# Patient Record
Sex: Female | Born: 1990 | Race: Black or African American | Hispanic: No | Smoking: Former smoker
Health system: Southern US, Community
[De-identification: ages and names within clinical notes are randomized; demographics above are authoritative.]

## PROBLEM LIST (undated history)

## (undated) DIAGNOSIS — B373 Candidiasis of vulva and vagina: Secondary | ICD-10-CM

## (undated) DIAGNOSIS — O24419 Gestational diabetes mellitus in pregnancy, unspecified control: Secondary | ICD-10-CM

## (undated) DIAGNOSIS — Z309 Encounter for contraceptive management, unspecified: Secondary | ICD-10-CM

## (undated) DIAGNOSIS — Z975 Presence of (intrauterine) contraceptive device: Secondary | ICD-10-CM

## (undated) DIAGNOSIS — Z3490 Encounter for supervision of normal pregnancy, unspecified, unspecified trimester: Secondary | ICD-10-CM

## (undated) DIAGNOSIS — A549 Gonococcal infection, unspecified: Secondary | ICD-10-CM

## (undated) DIAGNOSIS — N898 Other specified noninflammatory disorders of vagina: Secondary | ICD-10-CM

## (undated) DIAGNOSIS — A749 Chlamydial infection, unspecified: Secondary | ICD-10-CM

## (undated) HISTORY — DX: Chlamydial infection, unspecified: A74.9

## (undated) HISTORY — DX: Gonococcal infection, unspecified: A54.9

## (undated) HISTORY — PX: NO PAST SURGERIES: SHX2092

## (undated) HISTORY — DX: Candidiasis of vulva and vagina: B37.3

## (undated) HISTORY — DX: Encounter for supervision of normal pregnancy, unspecified, unspecified trimester: Z34.90

## (undated) HISTORY — DX: Presence of (intrauterine) contraceptive device: Z97.5

## (undated) HISTORY — DX: Encounter for contraceptive management, unspecified: Z30.9

## (undated) HISTORY — DX: Gestational diabetes mellitus in pregnancy, unspecified control: O24.419

## (undated) HISTORY — DX: Other specified noninflammatory disorders of vagina: N89.8

---
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
## 2010-06-02 ENCOUNTER — Emergency Department (HOSPITAL_COMMUNITY): Admission: EM | Admit: 2010-06-02 | Discharge: 2010-06-02 | Payer: Self-pay | Admitting: Emergency Medicine

## 2010-10-26 ENCOUNTER — Emergency Department (HOSPITAL_COMMUNITY)
Admission: EM | Admit: 2010-10-26 | Discharge: 2010-10-26 | Payer: Self-pay | Source: Home / Self Care | Admitting: Emergency Medicine

## 2010-12-09 ENCOUNTER — Emergency Department (HOSPITAL_COMMUNITY)
Admission: EM | Admit: 2010-12-09 | Discharge: 2010-12-09 | Payer: Self-pay | Source: Home / Self Care | Admitting: Emergency Medicine

## 2010-12-09 LAB — URINALYSIS, ROUTINE W REFLEX MICROSCOPIC
Bilirubin Urine: NEGATIVE
Hgb urine dipstick: NEGATIVE
Ketones, ur: NEGATIVE mg/dL
Specific Gravity, Urine: 1.02 (ref 1.005–1.030)
Urobilinogen, UA: 0.2 mg/dL (ref 0.0–1.0)

## 2010-12-09 LAB — BASIC METABOLIC PANEL
BUN: 10 mg/dL (ref 6–23)
CO2: 24 mEq/L (ref 19–32)
Calcium: 8.9 mg/dL (ref 8.4–10.5)
Chloride: 100 mEq/L (ref 96–112)
Creatinine, Ser: 0.87 mg/dL (ref 0.4–1.2)
GFR calc Af Amer: >60 mL/min (ref >60)
Glucose, Bld: 89 mg/dL (ref 70–99)
Sodium: 133 mEq/L — ABNORMAL LOW (ref 135–145)

## 2010-12-09 LAB — CBC
Hemoglobin: 11.3 g/dL — ABNORMAL LOW (ref 12.0–15.0)
MCH: 26.7 pg (ref 26.0–34.0)
RBC: 4.24 MIL/uL (ref 3.87–5.11)
WBC: 6.9 10*3/uL (ref 4.0–10.5)

## 2010-12-09 LAB — DIFFERENTIAL
Basophils Relative: 1 % (ref 0–1)
Monocytes Absolute: 0.8 10*3/uL (ref 0.1–1.0)
Neutro Abs: 4.2 10*3/uL (ref 1.7–7.7)

## 2011-01-25 LAB — BASIC METABOLIC PANEL
BUN: 10 mg/dL (ref 6–23)
Calcium: 9.8 mg/dL (ref 8.4–10.5)
Chloride: 102 mEq/L (ref 96–112)
GFR calc non Af Amer: >60 mL/min (ref >60)
Potassium: 4.3 mEq/L (ref 3.5–5.1)

## 2011-01-25 LAB — URINALYSIS, ROUTINE W REFLEX MICROSCOPIC
Bilirubin Urine: NEGATIVE
Glucose, UA: NEGATIVE mg/dL
Nitrite: NEGATIVE
Protein, ur: NEGATIVE mg/dL
Specific Gravity, Urine: 1.015 (ref 1.005–1.030)

## 2011-01-25 LAB — CBC
MCH: 26.6 pg (ref 26.0–34.0)
RDW: 14.4 % (ref 11.5–15.5)
WBC: 7.7 10*3/uL (ref 4.0–10.5)

## 2011-01-25 LAB — GC/CHLAMYDIA PROBE AMP, GENITAL
Chlamydia, DNA Probe: POSITIVE — AB
GC Probe Amp, Genital: POSITIVE — AB

## 2011-01-25 LAB — DIFFERENTIAL
Basophils Absolute: 0 10*3/uL (ref 0.0–0.1)
Eosinophils Absolute: 0.1 10*3/uL (ref 0.0–0.7)
Neutro Abs: 5 10*3/uL (ref 1.7–7.7)

## 2011-01-25 LAB — URINE MICROSCOPIC-ADD ON

## 2011-01-25 LAB — WET PREP, GENITAL: Yeast Wet Prep HPF POC: NONE SEEN

## 2011-01-25 LAB — POCT PREGNANCY, URINE: Preg Test, Ur: NEGATIVE
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
## 2012-12-08 ENCOUNTER — Encounter (HOSPITAL_COMMUNITY): Payer: Self-pay | Admitting: *Deleted

## 2012-12-08 ENCOUNTER — Emergency Department (HOSPITAL_COMMUNITY)
Admission: EM | Admit: 2012-12-08 | Discharge: 2012-12-08 | Disposition: A | Discharge status: Home / Self Care | Payer: Self-pay | Attending: Emergency Medicine | Admitting: Emergency Medicine

## 2012-12-08 DIAGNOSIS — R21 Rash and other nonspecific skin eruption: Secondary | ICD-10-CM | POA: Insufficient documentation

## 2012-12-08 DIAGNOSIS — F17200 Nicotine dependence, unspecified, uncomplicated: Secondary | ICD-10-CM | POA: Insufficient documentation

## 2012-12-08 MED ORDER — FEXOFENADINE HCL 180 MG PO TABS: 180.0000 mg | ORAL_TABLET | Freq: Every day | ORAL | Status: DC

## 2012-12-08 MED ORDER — FAMOTIDINE 20 MG PO TABS
20.0000 mg | ORAL_TABLET | Freq: Once | ORAL | Status: AC
  Administered 2012-12-08: 20 mg via ORAL
  Filled 2012-12-08: qty 1

## 2012-12-08 MED ORDER — ONDANSETRON HCL 4 MG PO TABS
4.0000 mg | ORAL_TABLET | Freq: Once | ORAL | Status: AC
  Administered 2012-12-08: 4 mg via ORAL
  Filled 2012-12-08: qty 1

## 2012-12-08 MED ORDER — PREDNISONE 10 MG PO TABS: ORAL_TABLET | ORAL | Status: DC

## 2012-12-08 MED ORDER — DIPHENHYDRAMINE HCL 25 MG PO CAPS
25.0000 mg | ORAL_CAPSULE | Freq: Once | ORAL | Status: AC
  Administered 2012-12-08: 25 mg via ORAL
  Filled 2012-12-08: qty 1

## 2012-12-08 MED ORDER — HYDROXYZINE HCL 25 MG PO TABS: ORAL_TABLET | ORAL | Status: DC

## 2012-12-08 MED ORDER — PREDNISONE 50 MG PO TABS
60.0000 mg | ORAL_TABLET | Freq: Once | ORAL | Status: AC
  Administered 2012-12-08: 60 mg via ORAL
  Filled 2012-12-08: qty 1

## 2012-12-08 NOTE — ED Notes (Signed)
Itching red rash for 3 days.

## 2012-12-08 NOTE — ED Provider Notes (Addendum)
History     CSN: [id number]  Arrival date & time 12/08/12  1353   First MD Initiated Contact with Patient 12/08/12 1445      Chief Complaint  Patient presents with  . Rash    (Consider location/radiation/quality/duration/timing/severity/associated sxs/prior treatment) Patient is a 22 y.o. female presenting with rash. The history is provided by the patient.  Rash  This is a new problem. The current episode started more than 2 days ago. The problem has been gradually worsening. The problem is associated with an unknown factor. There has been no fever. The rash is present on the abdomen and back. The patient is experiencing no pain. Associated symptoms include itching. Pertinent negatives include no weeping. She has tried antihistamines for the symptoms. The treatment provided no relief.    History reviewed. No pertinent past medical history.  History reviewed. No pertinent past surgical history.  History reviewed. No pertinent family history.  History  Substance Use Topics  . Smoking status: Current Every Day Smoker  . Smokeless tobacco: Not on file  . Alcohol Use: No    OB History    Grav Para Term Preterm Abortions TAB SAB Ect Mult Living                  Review of Systems  Constitutional: Negative for activity change.       All ROS Neg except as noted in HPI  HENT: Negative for nosebleeds and neck pain.   Eyes: Negative for photophobia and discharge.  Respiratory: Negative for cough, shortness of breath and wheezing.   Cardiovascular: Negative for chest pain and palpitations.  Gastrointestinal: Negative for abdominal pain and blood in stool.  Genitourinary: Negative for dysuria, frequency and hematuria.  Musculoskeletal: Negative for back pain and arthralgias.  Skin: Positive for itching and rash.  Neurological: Negative for dizziness, seizures and speech difficulty.  Psychiatric/Behavioral: Negative for hallucinations and confusion.    Allergies  Review of  patient's allergies indicates no known allergies.  Home Medications   Current Outpatient Rx  Name  Route  Sig  Dispense  Refill  . FEXOFENADINE HCL 180 MG PO TABS   Oral   Take 1 tablet (180 mg total) by mouth daily.   10 tablet   0   . HYDROXYZINE HCL 25 MG PO TABS      1 at hs or q6h prn hives/itching   20 tablet   0   . PREDNISONE 10 MG PO TABS      6,5,4,3,2,1 - take with food   21 tablet   0     BP 125/67  Pulse 70  Temp 97.7 F (36.5 C) (Oral)  Resp 18  Ht 4\' 11"  (1.499 m)  Wt 142 lb (64.411 kg)  BMI 28.68 kg/m2  SpO2 100%  LMP 11/11/2012  Physical Exam  Nursing note and vitals reviewed. Constitutional: She is oriented to person, place, and time. She appears well-developed and well-nourished.  Non-toxic appearance.  HENT:  Head: Normocephalic.  Right Ear: Tympanic membrane and external ear normal.  Left Ear: Tympanic membrane and external ear normal.  Eyes: EOM and lids are normal. Pupils are equal, round, and reactive to light.  Neck: Normal range of motion. Neck supple. Carotid bruit is not present.  Cardiovascular: Normal rate, regular rhythm, normal heart sounds, intact distal pulses and normal pulses.   Pulmonary/Chest: Breath sounds normal. No stridor. No respiratory distress. She has no wheezes.  Abdominal: Soft. Bowel sounds are normal. There is no tenderness.  There is no guarding.  Musculoskeletal: Normal range of motion.  Lymphadenopathy:       Head (right side): No submandibular adenopathy present.       Head (left side): No submandibular adenopathy present.    She has no cervical adenopathy.  Neurological: She is alert and oriented to person, place, and time. She has normal strength. No cranial nerve deficit or sensory deficit.  Skin: Skin is warm and dry.       RED RAISED RASH ON THE RIGHT FLANK AND LEFT ABD. 1 ON THE BACK.  Psychiatric: She has a normal mood and affect. Her speech is normal.    ED Course  Procedures (including critical  care time)  Labs Reviewed - No data to display No results found.   1. Hive       MDM  I have reviewed nursing notes, vital signs, and all appropriate lab and imaging results for this patient. Patient is a rash on the right flank area. On the left abdomen and one lesion on the back. Explained to the patient that this may be a viral rash, a contact dermatitis, or a hive. Patient is treated with Allegra each morning and Vistaril at bedtime and is given a referral to dermatology she has an allergy to peanuts and a few other things. Patient is advised to return to the emergency department immediately if any changes, problems, or concerns.       Kathie Dike, PA 12/08/12 [address] [place], [place] 12/21/12 [phone]

## 2012-12-09 NOTE — ED Provider Notes (Signed)
Medical screening examination/treatment/procedure(s) were performed by non-physician practitioner and as supervising physician I was immediately available for consultation/collaboration.  Raeford Razor, MD 12/09/12 [phone]

## 2012-12-23 NOTE — ED Provider Notes (Signed)
Medical screening examination/treatment/procedure(s) were performed by non-physician practitioner and as supervising physician I was immediately available for consultation/collaboration.  Halford Goetzke, MD 12/23/12 2346 

## 2013-02-15 ENCOUNTER — Ambulatory Visit (INDEPENDENT_AMBULATORY_CARE_PROVIDER_SITE_OTHER): Payer: Self-pay | Admitting: Family Medicine

## 2013-02-15 ENCOUNTER — Encounter: Payer: Self-pay | Admitting: Family Medicine

## 2013-02-15 VITALS — BP 112/80 | Temp 98.8°F | Wt 145.8 lb

## 2013-02-15 DIAGNOSIS — R300 Dysuria: Secondary | ICD-10-CM

## 2013-02-15 DIAGNOSIS — N912 Amenorrhea, unspecified: Secondary | ICD-10-CM

## 2013-02-15 DIAGNOSIS — N390 Urinary tract infection, site not specified: Secondary | ICD-10-CM

## 2013-02-15 LAB — POCT URINALYSIS DIPSTICK
Glucose, UA: NEGATIVE
Spec Grav, UA: 1.01
pH, UA: 6

## 2013-02-15 LAB — POCT URINE PREGNANCY: Preg Test, Ur: NEGATIVE

## 2013-02-15 MED ORDER — CIPROFLOXACIN HCL 500 MG PO TABS: 500.0000 mg | ORAL_TABLET | Freq: Two times a day (BID) | ORAL | Status: AC

## 2013-02-15 NOTE — Progress Notes (Signed)
  Subjective:    Patient ID: Courtney Murray, female    DOB: 1990-12-24, 22 y.o.   MRN: [id number]  Dysuria  This is a new problem. The current episode started in the past 7 days. The problem occurs every urination. The problem has been gradually worsening. The quality of the pain is described as burning. There has been no fever. She is sexually active. There is no history of pyelonephritis. Associated symptoms include chills, flank pain (left side), nausea and vomiting. She has tried increased fluids for the symptoms. The treatment provided mild relief.      Review of Systems  Constitutional: Positive for chills.  Gastrointestinal: Positive for nausea and vomiting.  Genitourinary: Positive for dysuria and flank pain (left side).  All other systems reviewed and are negative.   Results for orders placed in visit on 02/15/13  POCT URINALYSIS DIPSTICK      Result Value Range   Color, UA       Clarity, UA       Glucose, UA neg     Bilirubin, UA       Ketones, UA       Spec Grav, UA 1.010     Blood, UA neg     pH, UA 6.0     Protein, UA small     Urobilinogen, UA       Nitrite, UA       Leukocytes, UA moderate (2+)    POCT URINE PREGNANCY      Result Value Range   Preg Test, Ur Negative       8-10 white blood cells per high-power field Objective:   Physical Exam   Alert no acute distress. Vitals reviewed. HEENT normal. Lungs clear. Heart regular in rhythm. No true right CVA tenderness, however some lateral chest wall tenderness on the right side.     Assessment & Plan:  Impression urinary tract infection with vomiting times one and right lateral pain will cover for possible pyelonephritis discussed. Plan as per orders.

## 2013-02-15 NOTE — Patient Instructions (Signed)
Take all the antibiotics 

## 2013-06-05 ENCOUNTER — Encounter: Payer: Self-pay | Admitting: Family Medicine

## 2013-06-05 ENCOUNTER — Ambulatory Visit (INDEPENDENT_AMBULATORY_CARE_PROVIDER_SITE_OTHER): Payer: Self-pay | Admitting: Family Medicine

## 2013-06-05 VITALS — BP 110/70 | Temp 98.1°F | Wt 148.4 lb

## 2013-06-05 DIAGNOSIS — J069 Acute upper respiratory infection, unspecified: Secondary | ICD-10-CM

## 2013-06-05 DIAGNOSIS — H6691 Otitis media, unspecified, right ear: Secondary | ICD-10-CM

## 2013-06-05 DIAGNOSIS — H6690 Otitis media, unspecified, unspecified ear: Secondary | ICD-10-CM

## 2013-06-05 MED ORDER — AMOXICILLIN 500 MG PO TABS: 500.0000 mg | ORAL_TABLET | Freq: Three times a day (TID) | ORAL | Status: DC

## 2013-06-05 NOTE — Patient Instructions (Signed)
Loratadine 10 mg take one daily for next 1 to 2 weeks  May use ibuprofen 200 mg , take 3 tablets as needed every 6 hours for pain

## 2013-06-05 NOTE — Progress Notes (Signed)
  Subjective:    Patient ID: Courtney Murray, female    DOB: 04-16-1991, 22 y.o.   MRN: [id number]  Otalgia  There is pain in the right ear. This is a new problem. The current episode started yesterday.   Patient with a little bit head congestion some stuffiness no wheezing or difficulty breathing complains right ear pain denies cough or vomiting   Review of Systems  HENT: Positive for ear pain.        Objective:   Physical Exam Eardrums left side normal right side otitis media throat is normal neck supple lungs clear       Assessment & Plan:  URI with right otitis-amoxicillin 3 times a day 10 days, Tylenol when necessary or ibuprofen when necessary pain, loratadine daily for the next couple weeks if any trouble let us know

## 2014-01-16 ENCOUNTER — Ambulatory Visit: Payer: BC Managed Care – PPO | Admitting: Nurse Practitioner

## 2014-04-29 ENCOUNTER — Ambulatory Visit (INDEPENDENT_AMBULATORY_CARE_PROVIDER_SITE_OTHER): Payer: BC Managed Care – PPO | Admitting: Adult Health

## 2014-04-29 ENCOUNTER — Encounter: Payer: Self-pay | Admitting: Adult Health

## 2014-04-29 DIAGNOSIS — Z3202 Encounter for pregnancy test, result negative: Secondary | ICD-10-CM

## 2014-04-29 LAB — POCT URINE PREGNANCY: Preg Test, Ur: NEGATIVE

## 2014-05-09 ENCOUNTER — Encounter: Payer: Self-pay | Admitting: Adult Health

## 2014-05-09 ENCOUNTER — Other Ambulatory Visit (HOSPITAL_COMMUNITY)
Admission: RE | Admit: 2014-05-09 | Discharge: 2014-05-09 | Disposition: A | Discharge status: Home / Self Care | Payer: BC Managed Care – PPO | Source: Ambulatory Visit | Attending: Obstetrics and Gynecology | Admitting: Obstetrics and Gynecology

## 2014-05-09 ENCOUNTER — Ambulatory Visit (INDEPENDENT_AMBULATORY_CARE_PROVIDER_SITE_OTHER): Payer: BC Managed Care – PPO | Admitting: Adult Health

## 2014-05-09 VITALS — BP 118/70 | HR 76 | Ht <= 58 in | Wt 155.5 lb

## 2014-05-09 DIAGNOSIS — Z309 Encounter for contraceptive management, unspecified: Secondary | ICD-10-CM

## 2014-05-09 DIAGNOSIS — Z01419 Encounter for gynecological examination (general) (routine) without abnormal findings: Secondary | ICD-10-CM

## 2014-05-09 DIAGNOSIS — Z30011 Encounter for initial prescription of contraceptive pills: Secondary | ICD-10-CM

## 2014-05-09 HISTORY — DX: Encounter for contraceptive management, unspecified: Z30.9

## 2014-05-09 MED ORDER — NORETHIN ACE-ETH ESTRAD-FE 1-20 MG-MCG(24) PO CHEW
CHEWABLE_TABLET | ORAL | Status: DC
Start: 2014-05-09 — End: 2014-11-25

## 2014-05-09 NOTE — Progress Notes (Signed)
Patient ID: Courtney Murray, female   DOB: Aug 21, 1991, 23 y.o.   MRN: [id number] History of Present Illness:  Courtney Murray is a [age] year old black female, single, new to this practice, in for pap and physical and to discuss birth control.Has used pills in past.  Current Medications, Allergies, Past Medical History, Past Surgical History, Family History and Social History were reviewed in [name].     Review of Systems: Patient denies any headaches, blurred vision, shortness of breath, chest pain, abdominal pain, problems with bowel movements, urination, or intercourse. No discharge or odor. No joint pain or mood swings.   Physical Exam:BP 118/70  Pulse 76  Ht 4\' 10"  (1.473 m)  Wt 155 lb 8 oz (70.534 kg)  BMI 32.51 kg/m2  LMP 05/02/2014 General:  Well developed, well nourished, no acute distress Skin:  Warm and dry Neck:  Midline trachea, normal thyroid Lungs; Clear to auscultation bilaterally Breast:  No dominant palpable mass, retraction, or nipple discharge Cardiovascular: Regular rate and rhythm Abdomen:  Soft, non tender, no hepatosplenomegaly Pelvic:  External genitalia is normal in appearance.  The vagina is normal in appearance. The cervix is nulliparous, pap performed.  Uterus is felt to be normal size, shape, and contour.  No                adnexal masses or tenderness noted. Extremities:  No swelling or varicosities noted Psych:  No mood changes, alert and cooperative,seems happy, works at [employer].   Impression: Yearly gyn exam Contraceptive management    Plan: Rx minastrin take 1 daily disp 1 pack with 11 refills Use condoms Physical in 1 year Return in 3 months for ROS Review handout on OC use and nexplanon

## 2014-05-09 NOTE — Patient Instructions (Signed)
Oral Contraception Use Oral contraceptive pills (OCPs) are medicines taken to prevent pregnancy. OCPs work by preventing the ovaries from releasing eggs. The hormones in OCPs also cause the cervical mucus to thicken, preventing the sperm from entering the uterus. The hormones also cause the uterine lining to become thin, not allowing a fertilized egg to attach to the inside of the uterus. OCPs are highly effective when taken exactly as prescribed. However, OCPs do not prevent sexually transmitted diseases (STDs). Safe sex practices, such as using condoms along with an OCP, can help prevent STDs. Before taking OCPs, you may have a physical exam and Pap test. Your health care provider may also order blood tests if necessary. Your health care provider will make sure you are a good candidate for oral contraception. Discuss with your health care provider the possible side effects of the OCP you may be prescribed. When starting an OCP, it can take 2 to 3 months for the body to adjust to the changes in hormone levels in your body.  HOW TO TAKE ORAL CONTRACEPTIVE PILLS Your health care provider may advise you on how to start taking the first cycle of OCPs. Otherwise, you can:   Start on day 1 of your menstrual period. You will not need any backup contraceptive protection with this start time.   Start on the first Sunday after your menstrual period or the day you get your prescription. In these cases, you will need to use backup contraceptive protection for the first week.   Start the pill at any time of your cycle. If you take the pill within 5 days of the start of your period, you are protected against pregnancy right away. In this case, you will not need a backup form of birth control. If you start at any other time of your menstrual cycle, you will need to use another form of birth control for 7 days. If your OCP is the type called a minipill, it will protect you from pregnancy after taking it for 2 days (48  hours). After you have started taking OCPs:   If you forget to take 1 pill, take it as soon as you remember. Take the next pill at the regular time.   If you miss 2 or more pills, call your health care provider because different pills have different instructions for missed doses. Use backup birth control until your next menstrual period starts.   If you use a 28-day pack that contains inactive pills and you miss 1 of the last 7 pills (pills with no hormones), it will not matter. Throw away the rest of the nonhormone pills and start a new pill pack.  No matter which day you start the OCP, you will always start a new pack on that same day of the week. Have an extra pack of OCPs and a backup contraceptive method available in case you miss some pills or lose your OCP pack.  HOME CARE INSTRUCTIONS   Do not smoke.   Always use a condom to protect against STDs. OCPs do not protect against STDs.   Use a calendar to mark your menstrual period days.   Read the information and directions that came with your OCP. Talk to your health care provider if you have questions.  SEEK MEDICAL CARE IF:   You develop nausea and vomiting.   You have abnormal vaginal discharge or bleeding.   You develop a rash.   You miss your menstrual period.   You are losing   your hair.   You need treatment for mood swings or depression.   You get dizzy when taking the OCP.   You develop acne from taking the OCP.   You become pregnant.  SEEK IMMEDIATE MEDICAL CARE IF:   You develop chest pain.   You develop shortness of breath.   You have an uncontrolled or severe headache.   You develop numbness or slurred speech.   You develop visual problems.   You develop pain, redness, and swelling in the legs.  Document Released: 10/21/2011 Document Revised: 07/04/2013 Document Reviewed: 04/22/2013 [facility] Patient Information 2015 [place], [place]. This information is not intended to replace  advice given to you by your health care provider. Make sure you discuss any questions you have with your health care provider. Start OCs with next period Use condoms  follow up in 3 months Physical in 1ear

## 2014-05-13 LAB — CYTOLOGY - PAP

## 2014-08-05 ENCOUNTER — Encounter: Payer: Self-pay | Admitting: Family Medicine

## 2014-08-05 ENCOUNTER — Ambulatory Visit (INDEPENDENT_AMBULATORY_CARE_PROVIDER_SITE_OTHER): Payer: BC Managed Care – PPO | Admitting: Family Medicine

## 2014-08-05 VITALS — BP 120/80 | Temp 98.7°F | Ht <= 58 in | Wt 154.6 lb

## 2014-08-05 DIAGNOSIS — J329 Chronic sinusitis, unspecified: Secondary | ICD-10-CM

## 2014-08-05 MED ORDER — BENZONATATE 100 MG PO CAPS: 100.0000 mg | ORAL_CAPSULE | Freq: Four times a day (QID) | ORAL | Status: DC | PRN

## 2014-08-05 MED ORDER — AZITHROMYCIN 250 MG PO TABS: ORAL_TABLET | ORAL | Status: DC

## 2014-08-05 NOTE — Progress Notes (Signed)
   Subjective:    Patient ID: Courtney Murray, female    DOB: 12/10/1990, 23 y.o.   MRN: [id number]  Fever  This is a new problem. The current episode started in the past 7 days. Associated symptoms include coughing, a sore throat, vomiting and wheezing.   When weather changes or with a lot of dust can get aller  Woke up in a sweat and vomited  Threw up twice  No diarrhea  Some clear dischage, Cough very bad night'  Chilled at times    No sig heada hes except with coughing Review of Systems  Constitutional: Positive for fever.  HENT: Positive for sore throat.   Respiratory: Positive for cough and wheezing.   Gastrointestinal: Positive for vomiting.       Objective:   Physical Exam  Alert moderate malaise. H&T moderate his congestion. Frontal tenderness pharynx erythematous neck supple. Lungs clear. Heart regular in rhythm.     Assessment & Plan:  Impression acute rhinosinusitis plan antibiotics prescribed. Symptomatic care discussed. WSL

## 2014-08-09 ENCOUNTER — Ambulatory Visit: Payer: BC Managed Care – PPO | Admitting: Adult Health

## 2014-08-27 ENCOUNTER — Encounter: Payer: Self-pay | Admitting: Adult Health

## 2014-08-27 ENCOUNTER — Ambulatory Visit (INDEPENDENT_AMBULATORY_CARE_PROVIDER_SITE_OTHER): Payer: BC Managed Care – PPO | Admitting: Adult Health

## 2014-08-27 VITALS — BP 112/60 | Ht <= 58 in | Wt 152.0 lb

## 2014-08-27 DIAGNOSIS — B3731 Acute candidiasis of vulva and vagina: Secondary | ICD-10-CM | POA: Insufficient documentation

## 2014-08-27 DIAGNOSIS — L298 Other pruritus: Secondary | ICD-10-CM

## 2014-08-27 DIAGNOSIS — B373 Candidiasis of vulva and vagina: Secondary | ICD-10-CM

## 2014-08-27 DIAGNOSIS — N898 Other specified noninflammatory disorders of vagina: Secondary | ICD-10-CM

## 2014-08-27 HISTORY — DX: Acute candidiasis of vulva and vagina: B37.31

## 2014-08-27 HISTORY — DX: Other specified noninflammatory disorders of vagina: N89.8

## 2014-08-27 HISTORY — DX: Candidiasis of vulva and vagina: B37.3

## 2014-08-27 LAB — POCT WET PREP (WET MOUNT): WBC, Wet Prep HPF POC: POSITIVE

## 2014-08-27 MED ORDER — FLUCONAZOLE 150 MG PO TABS: ORAL_TABLET | ORAL | Status: DC

## 2014-08-27 NOTE — Progress Notes (Signed)
Subjective:     Patient ID: Courtney Murray, female   DOB: October 05, 1991, 23 y.o.   MRN: [id number]  HPI Courtney Murray is a 23 year old black female in complaining of vaginal itching x 4-5 days, was on antibiotics about 2-3 weeks ago, no new sex partners or products.  Review of Systems See HPI Reviewed past medical,surgical, social and family history. Reviewed medications and allergies.     Objective:   Physical Exam BP 112/60  Ht 4\' 9"  (1.448 m)  Wt 152 lb (68.947 kg)  BMI 32.88 kg/m2  LMP 08/14/2014   Skin warm and dry.Pelvic: external genitalia is normal in appearance red in creases, vagina: white clumpy discharge without odor, red sidewalls, cervix:smooth, uterus: normal size, shape and contour, non tender, no masses felt, adnexa: no masses or tenderness noted. Wet prep: + for yeast and +WBCs.Painted with gentian violet after removing some discharge with big Q tip. GC/CHL obtained.   Assessment:     Vaginal itch Yeast infection    Plan:    GC/CHL sent Rx diflucan 150 mg #2 take 1 now and 1 in 3 days if needed with 1 refill Follow up prn Review handout on yeast infection

## 2014-08-27 NOTE — Patient Instructions (Signed)
Monilial Vaginitis Vaginitis in a soreness, swelling and redness (inflammation) of the vagina and vulva. Monilial vaginitis is not a sexually transmitted infection. CAUSES  Yeast vaginitis is caused by yeast (candida) that is normally found in your vagina. With a yeast infection, the candida has overgrown in number to a point that upsets the chemical balance. SYMPTOMS   White, thick vaginal discharge.  Swelling, itching, redness and irritation of the vagina and possibly the lips of the vagina (vulva).  Burning or painful urination.  Painful intercourse. DIAGNOSIS  Things that may contribute to monilial vaginitis are:  Postmenopausal and virginal states.  Pregnancy.  Infections.  Being tired, sick or stressed, especially if you had monilial vaginitis in the past.  Diabetes. Good control will help lower the chance.  Birth control pills.  Tight fitting garments.  Using bubble bath, feminine sprays, douches or deodorant tampons.  Taking certain medications that kill germs (antibiotics).  Sporadic recurrence can occur if you become ill. TREATMENT  Your caregiver will give you medication.  There are several kinds of anti monilial vaginal creams and suppositories specific for monilial vaginitis. For recurrent yeast infections, use a suppository or cream in the vagina 2 times a week, or as directed.  Anti-monilial or steroid cream for the itching or irritation of the vulva may also be used. Get your caregiver's permission.  Painting the vagina with methylene blue solution may help if the monilial cream does not work.  Eating yogurt may help prevent monilial vaginitis. HOME CARE INSTRUCTIONS   Finish all medication as prescribed.  Do not have sex until treatment is completed or after your caregiver tells you it is okay.  Take warm sitz baths.  Do not douche.  Do not use tampons, especially scented ones.  Wear cotton underwear.  Avoid tight pants and panty  hose.  Tell your sexual partner that you have a yeast infection. They should go to their caregiver if they have symptoms such as mild rash or itching.  Your sexual partner should be treated as well if your infection is difficult to eliminate.  Practice safer sex. Use condoms.  Some vaginal medications cause latex condoms to fail. Vaginal medications that harm condoms are:  Cleocin cream.  Butoconazole (Femstat).  Terconazole (Terazol) vaginal suppository.  Miconazole (Monistat) (may be purchased over the counter). SEEK MEDICAL CARE IF:   You have a temperature by mouth above 102 F (38.9 C).  The infection is getting worse after 2 days of treatment.  The infection is not getting better after 3 days of treatment.  You develop blisters in or around your vagina.  You develop vaginal bleeding, and it is not your menstrual period.  You have pain when you urinate.  You develop intestinal problems.  You have pain with sexual intercourse. Document Released: 08/11/2005 Document Revised: 01/24/2012 Document Reviewed: 04/25/2009 [facility] Patient Information 2015 [place], [place]. This information is not intended to replace advice given to you by your health care provider. Make sure you discuss any questions you have with your health care provider. No sex Take diflucan Follow up prn

## 2014-08-28 LAB — GC/CHLAMYDIA PROBE AMP
CT PROBE, AMP APTIMA: NEGATIVE
GC Probe RNA: NEGATIVE

## 2014-09-16 ENCOUNTER — Encounter: Payer: Self-pay | Admitting: Adult Health

## 2014-11-15 NOTE — L&D Delivery Note (Signed)
Operative Delivery Note  Verbal consent: obtained from patient.  Risks and benefits discussed in detail.  Risks include, but are not limited to the risks of anesthesia, bleeding, infection,  damage to maternal tissues, fetal cephalohematoma.  There is also the risk of inability to effect vaginal delivery of the head, or shoulder dystocia that cannot be resolved by established maneuvers, leading to the need for emergency cesarean section.   At 2:22 PM a viable female was delivered via Vaginal, [employer], operational).  Presentation: vertex; Position: Right,, Occiput,, Anterior; Station: +2.  APGAR: 8, 9. Placenta status: delivered spontaneously, intact, 3VC .   Cord: Nuchal cord x 1   Anesthesia: Epidural  Lacerations: 2nd degree;Perineal Suture Repair: 3.0 vicryl Est. Blood Loss (mL):  383 ml  Mom to postpartum.  Baby to Couplet care / Skin to Skin.  Tereso Newcomer, MD 07/19/2015, 2:54 PM

## 2014-11-25 ENCOUNTER — Ambulatory Visit (INDEPENDENT_AMBULATORY_CARE_PROVIDER_SITE_OTHER): Payer: 59 | Admitting: Adult Health

## 2014-11-25 ENCOUNTER — Encounter: Payer: Self-pay | Admitting: Adult Health

## 2014-11-25 VITALS — BP 110/60 | Ht <= 58 in | Wt 160.5 lb

## 2014-11-25 DIAGNOSIS — Z3490 Encounter for supervision of normal pregnancy, unspecified, unspecified trimester: Secondary | ICD-10-CM

## 2014-11-25 DIAGNOSIS — Z3201 Encounter for pregnancy test, result positive: Secondary | ICD-10-CM

## 2014-11-25 DIAGNOSIS — K5900 Constipation, unspecified: Secondary | ICD-10-CM

## 2014-11-25 HISTORY — DX: Encounter for supervision of normal pregnancy, unspecified, unspecified trimester: Z34.90

## 2014-11-25 LAB — POCT URINE PREGNANCY: Preg Test, Ur: POSITIVE

## 2014-11-25 NOTE — Progress Notes (Signed)
Subjective:     Patient ID: Courtney Murray, female   DOB: 05-20-1991, 24 y.o.   MRN: [id number]  HPI [name] is a 24 year old black female in for UPT, has constipation issue.  Review of Systems See HPI Reviewed past medical,surgical, social and family history. Reviewed medications and allergies.     Objective:   Physical Exam BP 110/60 mmHg  Ht 4\' 9"  (1.448 m)  Wt 160 lb 8 oz (72.802 kg)  BMI 34.72 kg/m2  LMP 10/17/2014   UPT +, about 5+4 weeks, EDD 07/26/15 by LMP, medicaid form given.Increase water and fruits to see if helps.  Assessment:     Pregnant +UPT Constipated    Plan:     Take flinstones Increase water and fruits, try prunes Review handout on first trimester Return in 2 weeks for dating [place]

## 2014-11-25 NOTE — Patient Instructions (Signed)
First Trimester of Pregnancy The first trimester of pregnancy is from week 1 until the end of week 12 (months 1 through 3). A week after a sperm fertilizes an egg, the egg will implant on the wall of the uterus. This embryo will begin to develop into a baby. Genes from you and your partner are forming the baby. The female genes determine whether the baby is a boy or a girl. At 6-8 weeks, the eyes and face are formed, and the heartbeat can be seen on ultrasound. At the end of 12 weeks, all the baby's organs are formed.  Now that you are pregnant, you will want to do everything you can to have a healthy baby. Two of the most important things are to get good prenatal care and to follow your health care provider's instructions. Prenatal care is all the medical care you receive before the baby's birth. This care will help prevent, find, and treat any problems during the pregnancy and childbirth. BODY CHANGES Your body goes through many changes during pregnancy. The changes vary from woman to woman.   You may gain or lose a couple of pounds at first.  You may feel sick to your stomach (nauseous) and throw up (vomit). If the vomiting is uncontrollable, call your health care provider.  You may tire easily.  You may develop headaches that can be relieved by medicines approved by your health care provider.  You may urinate more often. Painful urination may mean you have a bladder infection.  You may develop heartburn as a result of your pregnancy.  You may develop constipation because certain hormones are causing the muscles that push waste through your intestines to slow down.  You may develop hemorrhoids or swollen, bulging veins (varicose veins).  Your breasts may begin to grow larger and become tender. Your nipples may stick out more, and the tissue that surrounds them (areola) may become darker.  Your gums may bleed and may be sensitive to brushing and flossing.  Dark spots or blotches (chloasma,  mask of pregnancy) may develop on your face. This will likely fade after the baby is born.  Your menstrual periods will stop.  You may have a loss of appetite.  You may develop cravings for certain kinds of food.  You may have changes in your emotions from day to day, such as being excited to be pregnant or being concerned that something may go wrong with the pregnancy and baby.  You may have more vivid and strange dreams.  You may have changes in your hair. These can include thickening of your hair, rapid growth, and changes in texture. Some women also have hair loss during or after pregnancy, or hair that feels dry or thin. Your hair will most likely return to normal after your baby is born. WHAT TO EXPECT AT YOUR PRENATAL VISITS During a routine prenatal visit:  You will be weighed to make sure you and the baby are growing normally.  Your blood pressure will be taken.  Your abdomen will be measured to track your baby's growth.  The fetal heartbeat will be listened to starting around week 10 or 12 of your pregnancy.  Test results from any previous visits will be discussed. Your health care provider may ask you:  How you are feeling.  If you are feeling the baby move.  If you have had any abnormal symptoms, such as leaking fluid, bleeding, severe headaches, or abdominal cramping.  If you have any questions. Other tests   that may be performed during your first trimester include:  Blood tests to find your blood type and to check for the presence of any previous infections. They will also be used to check for low iron levels (anemia) and Rh antibodies. Later in the pregnancy, blood tests for diabetes will be done along with other tests if problems develop.  Urine tests to check for infections, diabetes, or protein in the urine.  An ultrasound to confirm the proper growth and development of the baby.  An amniocentesis to check for possible genetic problems.  Fetal screens for  spina bifida and Down syndrome.  You may need other tests to make sure you and the baby are doing well. HOME CARE INSTRUCTIONS  Medicines  Follow your health care provider's instructions regarding medicine use. Specific medicines may be either safe or unsafe to take during pregnancy.  Take your prenatal vitamins as directed.  If you develop constipation, try taking a stool softener if your health care provider approves. Diet  Eat regular, well-balanced meals. Choose a variety of foods, such as meat or vegetable-based protein, fish, milk and low-fat dairy products, vegetables, fruits, and whole grain breads and cereals. Your health care provider will help you determine the amount of weight gain that is right for you.  Avoid raw meat and uncooked cheese. These carry germs that can cause birth defects in the baby.  Eating four or five small meals rather than three large meals a day may help relieve nausea and vomiting. If you start to feel nauseous, eating a few soda crackers can be helpful. Drinking liquids between meals instead of during meals also seems to help nausea and vomiting.  If you develop constipation, eat more high-fiber foods, such as fresh vegetables or fruit and whole grains. Drink enough fluids to keep your urine clear or pale yellow. Activity and Exercise  Exercise only as directed by your health care provider. Exercising will help you:  Control your weight.  Stay in shape.  Be prepared for labor and delivery.  Experiencing pain or cramping in the lower abdomen or low back is a good sign that you should stop exercising. Check with your health care provider before continuing normal exercises.  Try to avoid standing for long periods of time. Move your legs often if you must stand in one place for a long time.  Avoid heavy lifting.  Wear low-heeled shoes, and practice good posture.  You may continue to have sex unless your health care provider directs you  otherwise. Relief of Pain or Discomfort  Wear a good support bra for breast tenderness.   Take warm sitz baths to soothe any pain or discomfort caused by hemorrhoids. Use hemorrhoid cream if your health care provider approves.   Rest with your legs elevated if you have leg cramps or low back pain.  If you develop varicose veins in your legs, wear support hose. Elevate your feet for 15 minutes, 3-4 times a day. Limit salt in your diet. Prenatal Care  Schedule your prenatal visits by the twelfth week of pregnancy. They are usually scheduled monthly at first, then more often in the last 2 months before delivery.  Write down your questions. Take them to your prenatal visits.  Keep all your prenatal visits as directed by your health care provider. Safety  Wear your seat belt at all times when driving.  Make a list of emergency phone numbers, including numbers for family, friends, the hospital, and police and fire departments. General Tips    Ask your health care provider for a referral to a local prenatal education class. Begin classes no later than at the beginning of month 6 of your pregnancy.  Ask for help if you have counseling or nutritional needs during pregnancy. Your health care provider can offer advice or refer you to specialists for help with various needs.  Do not use hot tubs, steam rooms, or saunas.  Do not douche or use tampons or scented sanitary pads.  Do not cross your legs for long periods of time.  Avoid cat litter boxes and soil used by cats. These carry germs that can cause birth defects in the baby and possibly loss of the fetus by miscarriage or stillbirth.  Avoid all smoking, herbs, alcohol, and medicines not prescribed by your health care provider. Chemicals in these affect the formation and growth of the baby.  Schedule a dentist appointment. At home, brush your teeth with a soft toothbrush and be gentle when you floss. SEEK MEDICAL CARE IF:   You have  dizziness.  You have mild pelvic cramps, pelvic pressure, or nagging pain in the abdominal area.  You have persistent nausea, vomiting, or diarrhea.  You have a bad smelling vaginal discharge.  You have pain with urination.  You notice increased swelling in your face, hands, legs, or ankles. SEEK IMMEDIATE MEDICAL CARE IF:   You have a fever.  You are leaking fluid from your vagina.  You have spotting or bleeding from your vagina.  You have severe abdominal cramping or pain.  You have rapid weight gain or loss.  You vomit blood or material that looks like coffee grounds.  You are exposed to [place] measles and have never had them.  You are exposed to fifth disease or chickenpox.  You develop a severe headache.  You have shortness of breath.  You have any kind of trauma, such as from a fall or a car accident. Document Released: 10/26/2001 Document Revised: 03/18/2014 Document Reviewed: 09/11/2013 [facility] Patient Information 2015 [place], [place]. This information is not intended to replace advice given to you by your health care provider. Make sure you discuss any questions you have with your health care provider. Increase water and fruits Return in 2 weeks for dating [place]

## 2014-12-10 ENCOUNTER — Ambulatory Visit (INDEPENDENT_AMBULATORY_CARE_PROVIDER_SITE_OTHER): Payer: 59

## 2014-12-10 ENCOUNTER — Other Ambulatory Visit: Payer: Self-pay | Admitting: Adult Health

## 2014-12-10 DIAGNOSIS — O26841 Uterine size-date discrepancy, first trimester: Secondary | ICD-10-CM

## 2014-12-10 DIAGNOSIS — Z3490 Encounter for supervision of normal pregnancy, unspecified, unspecified trimester: Secondary | ICD-10-CM

## 2014-12-10 NOTE — Progress Notes (Signed)
U/S-single IUP with +FCA noted, FHR-121 bpm, CRL c/w 6+1wks EDD 08/04/2015, cx appears closed, bilateral adnexa appears WNL,

## 2014-12-23 ENCOUNTER — Ambulatory Visit (INDEPENDENT_AMBULATORY_CARE_PROVIDER_SITE_OTHER): Payer: 59 | Admitting: Women's Health

## 2014-12-23 ENCOUNTER — Encounter: Payer: Self-pay | Admitting: Women's Health

## 2014-12-23 VITALS — BP 106/78 | Wt 162.0 lb

## 2014-12-23 DIAGNOSIS — Z114 Encounter for screening for human immunodeficiency virus [HIV]: Secondary | ICD-10-CM

## 2014-12-23 DIAGNOSIS — Z3481 Encounter for supervision of other normal pregnancy, first trimester: Secondary | ICD-10-CM

## 2014-12-23 DIAGNOSIS — Z331 Pregnant state, incidental: Secondary | ICD-10-CM

## 2014-12-23 DIAGNOSIS — Z3401 Encounter for supervision of normal first pregnancy, first trimester: Secondary | ICD-10-CM

## 2014-12-23 DIAGNOSIS — Z113 Encounter for screening for infections with a predominantly sexual mode of transmission: Secondary | ICD-10-CM

## 2014-12-23 DIAGNOSIS — Z3491 Encounter for supervision of normal pregnancy, unspecified, first trimester: Secondary | ICD-10-CM

## 2014-12-23 DIAGNOSIS — Z118 Encounter for screening for other infectious and parasitic diseases: Secondary | ICD-10-CM

## 2014-12-23 DIAGNOSIS — Z1371 Encounter for nonprocreative screening for genetic disease carrier status: Secondary | ICD-10-CM

## 2014-12-23 DIAGNOSIS — Z0184 Encounter for antibody response examination: Secondary | ICD-10-CM

## 2014-12-23 DIAGNOSIS — O09899 Supervision of other high risk pregnancies, unspecified trimester: Secondary | ICD-10-CM | POA: Insufficient documentation

## 2014-12-23 DIAGNOSIS — Z0283 Encounter for blood-alcohol and blood-drug test: Secondary | ICD-10-CM

## 2014-12-23 DIAGNOSIS — Z3682 Encounter for antenatal screening for nuchal translucency: Secondary | ICD-10-CM

## 2014-12-23 DIAGNOSIS — Z23 Encounter for immunization: Secondary | ICD-10-CM

## 2014-12-23 DIAGNOSIS — Z1159 Encounter for screening for other viral diseases: Secondary | ICD-10-CM

## 2014-12-23 DIAGNOSIS — Z1389 Encounter for screening for other disorder: Secondary | ICD-10-CM

## 2014-12-23 DIAGNOSIS — Z130 Encounter for screening for diseases of the blood and blood-forming organs and certain disorders involving the immune mechanism: Secondary | ICD-10-CM

## 2014-12-23 LAB — POCT URINALYSIS DIPSTICK
Bilirubin, UA: NEGATIVE
Blood, UA: NEGATIVE
Glucose, UA: NEGATIVE
Ketones, UA: NEGATIVE
Leukocytes, UA: NEGATIVE
Nitrite, UA: NEGATIVE
Protein, UA: NEGATIVE

## 2014-12-23 LAB — OB RESULTS CONSOLE HIV ANTIBODY (ROUTINE TESTING): HIV: NONREACTIVE

## 2014-12-23 LAB — OB RESULTS CONSOLE RUBELLA ANTIBODY, IGM: Rubella: IMMUNE

## 2014-12-23 LAB — OB RESULTS CONSOLE ABO/RH: RH TYPE: POSITIVE

## 2014-12-23 MED ORDER — INFLUENZA VAC SPLIT QUAD 0.5 ML IM SUSY
0.5000 mL | PREFILLED_SYRINGE | Freq: Once | INTRAMUSCULAR | Status: AC
  Administered 2014-12-23: 0.5 mL via INTRAMUSCULAR

## 2014-12-23 NOTE — Patient Instructions (Signed)
Constipation  Drink plenty of fluid, preferably water, throughout the day  Eat foods high in fiber such as fruits, vegetables, and grains  Exercise, such as walking, is a good way to keep your bowels regular  Drink warm fluids, especially warm prune juice, or decaf coffee  Eat a 1/2 cup of real oatmeal (not instant), 1/2 cup applesauce, and 1/2-1 cup warm prune juice every day  If needed, you may take Colace (docusate sodium) stool softener once or twice a day to help keep the stool soft. If you are pregnant, wait until you are out of your first trimester (12-14 weeks of pregnancy)  If you still are having problems with constipation, you may take Miralax once daily as needed to help keep your bowels regular.  If you are pregnant, wait until you are out of your first trimester (12-14 weeks of pregnancy)   Tips to Help You Sleep Better:   Get into a bedtime routine, try to do the same thing every night before going to bed to try to help your body wind down  Warm baths  Avoid caffeine for at least 3 hours before going to sleep   Keep your room at a slightly cooler temperature, can try running a fan  Turn off TV, lights, phone, electronics  Lots of pillows if needed to help you get comfortable  Lavender scented items can help you sleep. You can place lavender essential oil on a cotton ball and place under your pillowcase, or place in a diffuser. [employer] has a lavender scented sleep line (plug-ins, sprays, etc). Look in the pillow aisle for lavender scented pillows.   Nausea & Vomiting  Have saltine crackers or pretzels by your bed and eat a few bites before you raise your head out of bed in the morning  Eat small frequent meals throughout the day instead of large meals  Drink plenty of fluids throughout the day to stay hydrated, just don't drink a lot of fluids with your meals.  This can make your stomach fill up faster making you feel sick  Do not brush your teeth right after  you eat  Products with real ginger are good for nausea, like ginger ale and ginger hard candy Make sure it says made with real ginger!  Sucking on sour candy like lemon heads is also good for nausea  If your prenatal vitamins make you nauseated, take them at night so you will sleep through the nausea  Sea Bands  If you feel like you need medicine for the nausea & vomiting please let us know  If you are unable to keep any fluids or food down please let us know    First Trimester of Pregnancy The first trimester of pregnancy is from week 1 until the end of week 12 (months 1 through 3). A week after a sperm fertilizes an egg, the egg will implant on the wall of the uterus. This embryo will begin to develop into a baby. Genes from you and your partner are forming the baby. The female genes determine whether the baby is a boy or a girl. At 6-8 weeks, the eyes and face are formed, and the heartbeat can be seen on ultrasound. At the end of 12 weeks, all the baby's organs are formed.  Now that you are pregnant, you will want to do everything you can to have a healthy baby. Two of the most important things are to get good prenatal care and to follow your health care  provider's instructions. Prenatal care is all the medical care you receive before the baby's birth. This care will help prevent, find, and treat any problems during the pregnancy and childbirth. BODY CHANGES Your body goes through many changes during pregnancy. The changes vary from woman to woman.   You may gain or lose a couple of pounds at first.  You may feel sick to your stomach (nauseous) and throw up (vomit). If the vomiting is uncontrollable, call your health care provider.  You may tire easily.  You may develop headaches that can be relieved by medicines approved by your health care provider.  You may urinate more often. Painful urination may mean you have a bladder infection.  You may develop heartburn as a result of your  pregnancy.  You may develop constipation because certain hormones are causing the muscles that push waste through your intestines to slow down.  You may develop hemorrhoids or swollen, bulging veins (varicose veins).  Your breasts may begin to grow larger and become tender. Your nipples may stick out more, and the tissue that surrounds them (areola) may become darker.  Your gums may bleed and may be sensitive to brushing and flossing.  Dark spots or blotches (chloasma, mask of pregnancy) may develop on your face. This will likely fade after the baby is born.  Your menstrual periods will stop.  You may have a loss of appetite.  You may develop cravings for certain kinds of food.  You may have changes in your emotions from day to day, such as being excited to be pregnant or being concerned that something may go wrong with the pregnancy and baby.  You may have more vivid and strange dreams.  You may have changes in your hair. These can include thickening of your hair, rapid growth, and changes in texture. Some women also have hair loss during or after pregnancy, or hair that feels dry or thin. Your hair will most likely return to normal after your baby is born. WHAT TO EXPECT AT YOUR PRENATAL VISITS During a routine prenatal visit:  You will be weighed to make sure you and the baby are growing normally.  Your blood pressure will be taken.  Your abdomen will be measured to track your baby's growth.  The fetal heartbeat will be listened to starting around week 10 or 12 of your pregnancy.  Test results from any previous visits will be discussed. Your health care provider may ask you:  How you are feeling.  If you are feeling the baby move.  If you have had any abnormal symptoms, such as leaking fluid, bleeding, severe headaches, or abdominal cramping.  If you have any questions. Other tests that may be performed during your first trimester include:  Blood tests to find your  blood type and to check for the presence of any previous infections. They will also be used to check for low iron levels (anemia) and Rh antibodies. Later in the pregnancy, blood tests for diabetes will be done along with other tests if problems develop.  Urine tests to check for infections, diabetes, or protein in the urine.  An ultrasound to confirm the proper growth and development of the baby.  An amniocentesis to check for possible genetic problems.  Fetal screens for spina bifida and Down syndrome.  You may need other tests to make sure you and the baby are doing well. HOME CARE INSTRUCTIONS  Medicines  Follow your health care provider's instructions regarding medicine use. Specific medicines may  be either safe or unsafe to take during pregnancy.  Take your prenatal vitamins as directed.  If you develop constipation, try taking a stool softener if your health care provider approves. Diet  Eat regular, well-balanced meals. Choose a variety of foods, such as meat or vegetable-based protein, fish, milk and low-fat dairy products, vegetables, fruits, and whole grain breads and cereals. Your health care provider will help you determine the amount of weight gain that is right for you.  Avoid raw meat and uncooked cheese. These carry germs that can cause birth defects in the baby.  Eating four or five small meals rather than three large meals a day may help relieve nausea and vomiting. If you start to feel nauseous, eating a few soda crackers can be helpful. Drinking liquids between meals instead of during meals also seems to help nausea and vomiting.  If you develop constipation, eat more high-fiber foods, such as fresh vegetables or fruit and whole grains. Drink enough fluids to keep your urine clear or pale yellow. Activity and Exercise  Exercise only as directed by your health care provider. Exercising will help you:  Control your weight.  Stay in shape.  Be prepared for labor  and delivery.  Experiencing pain or cramping in the lower abdomen or low back is a good sign that you should stop exercising. Check with your health care provider before continuing normal exercises.  Try to avoid standing for long periods of time. Move your legs often if you must stand in one place for a long time.  Avoid heavy lifting.  Wear low-heeled shoes, and practice good posture.  You may continue to have sex unless your health care provider directs you otherwise. Relief of Pain or Discomfort  Wear a good support bra for breast tenderness.   Take warm sitz baths to soothe any pain or discomfort caused by hemorrhoids. Use hemorrhoid cream if your health care provider approves.   Rest with your legs elevated if you have leg cramps or low back pain.  If you develop varicose veins in your legs, wear support hose. Elevate your feet for 15 minutes, 3-4 times a day. Limit salt in your diet. Prenatal Care  Schedule your prenatal visits by the twelfth week of pregnancy. They are usually scheduled monthly at first, then more often in the last 2 months before delivery.  Write down your questions. Take them to your prenatal visits.  Keep all your prenatal visits as directed by your health care provider. Safety  Wear your seat belt at all times when driving.  Make a list of emergency phone numbers, including numbers for family, friends, the hospital, and police and fire departments. General Tips  Ask your health care provider for a referral to a local prenatal education class. Begin classes no later than at the beginning of month 6 of your pregnancy.  Ask for help if you have counseling or nutritional needs during pregnancy. Your health care provider can offer advice or refer you to specialists for help with various needs.  Do not use hot tubs, steam rooms, or saunas.  Do not douche or use tampons or scented sanitary pads.  Do not cross your legs for long periods of  time.  Avoid cat litter boxes and soil used by cats. These carry germs that can cause birth defects in the baby and possibly loss of the fetus by miscarriage or stillbirth.  Avoid all smoking, herbs, alcohol, and medicines not prescribed by your health care provider. Chemicals  in these affect the formation and growth of the baby.  Schedule a dentist appointment. At home, brush your teeth with a soft toothbrush and be gentle when you floss. SEEK MEDICAL CARE IF:   You have dizziness.  You have mild pelvic cramps, pelvic pressure, or nagging pain in the abdominal area.  You have persistent nausea, vomiting, or diarrhea.  You have a bad smelling vaginal discharge.  You have pain with urination.  You notice increased swelling in your face, hands, legs, or ankles. SEEK IMMEDIATE MEDICAL CARE IF:   You have a fever.  You are leaking fluid from your vagina.  You have spotting or bleeding from your vagina.  You have severe abdominal cramping or pain.  You have rapid weight gain or loss.  You vomit blood or material that looks like coffee grounds.  You are exposed to [place] measles and have never had them.  You are exposed to fifth disease or chickenpox.  You develop a severe headache.  You have shortness of breath.  You have any kind of trauma, such as from a fall or a car accident. Document Released: 10/26/2001 Document Revised: 03/18/2014 Document Reviewed: 09/11/2013 [facility] Patient Information 2015 [place], [place]. This information is not intended to replace advice given to you by your health care provider. Make sure you discuss any questions you have with your health care provider.

## 2014-12-23 NOTE — Progress Notes (Signed)
  Subjective:  Courtney Murray is a 24 y.o. [id number] African American female at [id number] by 6wk u/s being seen today for her first obstetrical visit.  Her obstetrical history is significant for previous smoker: quit w/ +pt. H/O TAB x 1. .  Pregnancy history fully reviewed.  Patient reports constipation, problems sleeping. Denies vb, cramping, uti s/s, abnormal/malodorous vag d/c, or vulvovaginal itching/irritation.  BP 106/78 mmHg  Wt 162 lb (73.483 kg)  LMP 10/17/2014  HISTORY: OB History  Gravida Para Term Preterm AB SAB TAB Ectopic Multiple Living  2    1  1        # Outcome Date GA Lbr Len/2nd Weight Sex Delivery Anes PTL Lv  2 Current           1 TAB 2013             Past Medical History  Diagnosis Date  . Contraceptive management 05/09/2014  . Vaginal itching 08/27/2014  . Yeast infection of the vagina 08/27/2014  . Pregnant 11/25/2014  . Gonorrhea in female   . Chlamydia    History reviewed. No pertinent past surgical history. Family History  Problem Relation Age of Onset  . Stroke Paternal Grandmother   . Heart disease Paternal Grandmother   . Diabetes Maternal Grandmother     borderline  . Thyroid disease Maternal Grandmother   . Stroke Maternal Grandmother   . Other Maternal Grandmother     blood clots  . Sleep apnea Father   . Diabetes Mother     borderline  . Sleep apnea Brother     Exam   System:     General: Well developed & nourished, no acute distress   Skin: Warm & dry, normal coloration and turgor, no rashes   Neurologic: Alert & oriented, normal mood   Cardiovascular: Regular rate & rhythm   Respiratory: Effort & rate normal, LCTAB, acyanotic   Abdomen: Soft, non tender   Extremities: normal strength, tone   Thin prep pap smear 04/2014: neg FHR: 150 via informal u/s   Assessment:   Pregnancy: G2P0010 Patient Active Problem List   Diagnosis Date Noted  . Supervision of normal pregnancy 12/23/2014    Priority: High  . Vaginal itching  08/27/2014  . Yeast infection of the vagina 08/27/2014  . Contraceptive management 05/09/2014    [id number] New OB visit Constipation Problems sleeping  Plan:  Initial labs drawn Continue prenatal vitamins Problem list reviewed and updated Reviewed n/v relief measures and warning s/s to report Reviewed recommended weight gain based on pre-gravid BMI Encouraged well-balanced diet Genetic Screening discussed Integrated Screen: requested Cystic fibrosis screening discussed requested Ultrasound discussed; fetal survey: requested Follow up in 4 weeks for 1st it/nt and visit [facility] completed Flu shot today Gave printed info on constipation and sleep relief measures  Marge DuncansBooker, Xochil Shanker Randall CNM, [facility] LLCWHNP-BC 12/23/2014 11:32 AM

## 2014-12-24 LAB — CBC
HEMATOCRIT: 41 % (ref 34.0–46.6)
Hemoglobin: 13.3 g/dL (ref 11.1–15.9)
MCH: 25.6 pg — ABNORMAL LOW (ref 26.6–33.0)
MCHC: 32.4 g/dL (ref 31.5–35.7)
MCV: 79 fL (ref 79–97)
Platelets: 295 10*3/uL (ref 150–379)
RBC: 5.2 x10E6/uL (ref 3.77–5.28)
RDW: 14.6 % (ref 12.3–15.4)
WBC: 7.2 10*3/uL (ref 3.4–10.8)

## 2014-12-24 LAB — URINALYSIS, ROUTINE W REFLEX MICROSCOPIC
Bilirubin, UA: NEGATIVE
GLUCOSE, UA: NEGATIVE
KETONES UA: NEGATIVE
LEUKOCYTES UA: NEGATIVE
NITRITE UA: NEGATIVE
RBC, UA: NEGATIVE
Specific Gravity, UA: 1.025 (ref 1.005–1.030)
Urobilinogen, Ur: 1 mg/dL (ref 0.2–1.0)
pH, UA: 7.5 (ref 5.0–7.5)

## 2014-12-24 LAB — AB SCR+ANTIBODY ID: ANTIBODY SCREEN: POSITIVE — AB

## 2014-12-24 LAB — SICKLE CELL SCREEN: SICKLE CELL SCREEN: NEGATIVE

## 2014-12-24 LAB — ANTIBODY SCREEN

## 2014-12-24 LAB — HIV ANTIBODY (ROUTINE TESTING W REFLEX): HIV Screen 4th Generation wRfx: NONREACTIVE

## 2014-12-24 LAB — RPR: RPR Ser Ql: NONREACTIVE

## 2014-12-24 LAB — HEPATITIS B SURFACE ANTIGEN: Hepatitis B Surface Ag: NEGATIVE

## 2014-12-24 LAB — RUBELLA SCREEN: RUBELLA: 2.93 {index} (ref >0.99)

## 2014-12-25 LAB — URINE CULTURE: Organism ID, Bacteria: NO GROWTH

## 2014-12-25 LAB — GC/CHLAMYDIA PROBE AMP
Chlamydia trachomatis, NAA: NEGATIVE
NEISSERIA GONORRHOEAE BY PCR: NEGATIVE

## 2014-12-27 LAB — PMP SCREEN PROFILE (10S), URINE
Amphetamine Screen, Ur: NEGATIVE ng/mL
Barbiturate Screen, Ur: NEGATIVE ng/mL
Benzodiazepine Screen, Urine: NEGATIVE ng/mL
CANNABINOIDS UR QL SCN: NEGATIVE ng/mL
COCAINE(METAB.) SCREEN, URINE: NEGATIVE ng/mL
Creatinine(Crt), U: 158.9 mg/dL (ref 20.0–300.0)
Methadone Scn, Ur: NEGATIVE ng/mL
OXYCODONE+OXYMORPHONE UR QL SCN: NEGATIVE ng/mL
Opiate Scrn, Ur: NEGATIVE ng/mL
PCP Scrn, Ur: NEGATIVE ng/mL
Ph of Urine: 7.1 (ref 4.5–8.9)
Propoxyphene, Screen: NEGATIVE ng/mL

## 2014-12-27 LAB — CYSTIC FIBROSIS MUTATION 97: GENE DIS ANAL CARRIER INTERP BLD/T-IMP: NOT DETECTED

## 2014-12-27 LAB — ABO/RH: Rh Factor: POSITIVE

## 2015-01-21 ENCOUNTER — Encounter: Payer: Self-pay | Admitting: Women's Health

## 2015-01-21 ENCOUNTER — Ambulatory Visit (INDEPENDENT_AMBULATORY_CARE_PROVIDER_SITE_OTHER): Payer: Medicaid Other

## 2015-01-21 ENCOUNTER — Ambulatory Visit (INDEPENDENT_AMBULATORY_CARE_PROVIDER_SITE_OTHER): Payer: Medicaid Other | Admitting: Women's Health

## 2015-01-21 VITALS — BP 110/62 | HR 68 | Wt 162.0 lb

## 2015-01-21 DIAGNOSIS — Z3401 Encounter for supervision of normal first pregnancy, first trimester: Secondary | ICD-10-CM

## 2015-01-21 DIAGNOSIS — Z331 Pregnant state, incidental: Secondary | ICD-10-CM

## 2015-01-21 DIAGNOSIS — Z3682 Encounter for antenatal screening for nuchal translucency: Secondary | ICD-10-CM

## 2015-01-21 DIAGNOSIS — Z3481 Encounter for supervision of other normal pregnancy, first trimester: Secondary | ICD-10-CM

## 2015-01-21 DIAGNOSIS — Z1389 Encounter for screening for other disorder: Secondary | ICD-10-CM

## 2015-01-21 DIAGNOSIS — Z36 Encounter for antenatal screening of mother: Secondary | ICD-10-CM

## 2015-01-21 DIAGNOSIS — Z0184 Encounter for antibody response examination: Secondary | ICD-10-CM

## 2015-01-21 LAB — POCT URINALYSIS DIPSTICK
Blood, UA: NEGATIVE
Glucose, UA: NEGATIVE
KETONES UA: NEGATIVE
Leukocytes, UA: NEGATIVE
Nitrite, UA: NEGATIVE
PROTEIN UA: NEGATIVE

## 2015-01-21 NOTE — Addendum Note (Signed)
Addended by: Gaylyn RongEVANS, Neidra Girvan A on: 01/21/2015 10:54 AM   Modules accepted: Orders

## 2015-01-21 NOTE — Progress Notes (Signed)
U/S(12+1wks)-single IUP with +FCA noted, FHR-159 bpm, CRL c/w dates, cx appears closed (3.7cm), bilateral adnexa appears WNL, posterior Gr 0 placenta, NB present, NT-1.264mm

## 2015-01-21 NOTE — Progress Notes (Signed)
Low-risk OB appointment G2P0010 298w1d Estimated Date of Delivery: 08/04/15 BP 110/62 mmHg  Pulse 68  Wt 162 lb (73.483 kg)  LMP 10/17/2014  BP, weight, and urine reviewed.  Refer to obstetrical flow sheet for FH & FHR.  No fm yet. Denies cramping, lof, vb, or uti s/s. No complaints. NFPartnership offered, accepted, referral faxed  Reviewed today's normal nt u/s, s/s to report. Plan:  Continue routine obstetrical care  F/U in 4wks for OB appointment and 2nd IT 1st IT/NT and varicella today (wasn't sent at 1st visit)

## 2015-01-21 NOTE — Patient Instructions (Signed)
Anti-Lewis A antibody  Second Trimester of Pregnancy The second trimester is from week 13 through week 28, months 4 through 6. The second trimester is often a time when you feel your best. Your body has also adjusted to being pregnant, and you begin to feel better physically. Usually, morning sickness has lessened or quit completely, you may have more energy, and you may have an increase in appetite. The second trimester is also a time when the fetus is growing rapidly. At the end of the sixth month, the fetus is about 9 inches long and weighs about 1 pounds. You will likely begin to feel the baby move (quickening) between 18 and 20 weeks of the pregnancy. BODY CHANGES Your body goes through many changes during pregnancy. The changes vary from woman to woman.   Your weight will continue to increase. You will notice your lower abdomen bulging out.  You may begin to get stretch marks on your hips, abdomen, and breasts.  You may develop headaches that can be relieved by medicines approved by your health care provider.  You may urinate more often because the fetus is pressing on your bladder.  You may develop or continue to have heartburn as a result of your pregnancy.  You may develop constipation because certain hormones are causing the muscles that push waste through your intestines to slow down.  You may develop hemorrhoids or swollen, bulging veins (varicose veins).  You may have back pain because of the weight gain and pregnancy hormones relaxing your joints between the bones in your pelvis and as a result of a shift in weight and the muscles that support your balance.  Your breasts will continue to grow and be tender.  Your gums may bleed and may be sensitive to brushing and flossing.  Dark spots or blotches (chloasma, mask of pregnancy) may develop on your face. This will likely fade after the baby is born.  A dark line from your belly button to the pubic area (linea nigra) may  appear. This will likely fade after the baby is born.  You may have changes in your hair. These can include thickening of your hair, rapid growth, and changes in texture. Some women also have hair loss during or after pregnancy, or hair that feels dry or thin. Your hair will most likely return to normal after your baby is born. WHAT TO EXPECT AT YOUR PRENATAL VISITS During a routine prenatal visit:  You will be weighed to make sure you and the fetus are growing normally.  Your blood pressure will be taken.  Your abdomen will be measured to track your baby's growth.  The fetal heartbeat will be listened to.  Any test results from the previous visit will be discussed. Your health care provider may ask you:  How you are feeling.  If you are feeling the baby move.  If you have had any abnormal symptoms, such as leaking fluid, bleeding, severe headaches, or abdominal cramping.  If you have any questions. Other tests that may be performed during your second trimester include:  Blood tests that check for:  Low iron levels (anemia).  Gestational diabetes (between 24 and 28 weeks).  Rh antibodies.  Urine tests to check for infections, diabetes, or protein in the urine.  An ultrasound to confirm the proper growth and development of the baby.  An amniocentesis to check for possible genetic problems.  Fetal screens for spina bifida and Down syndrome. HOME CARE INSTRUCTIONS   Avoid all smoking,  herbs, alcohol, and unprescribed drugs. These chemicals affect the formation and growth of the baby.  Follow your health care provider's instructions regarding medicine use. There are medicines that are either safe or unsafe to take during pregnancy.  Exercise only as directed by your health care provider. Experiencing uterine cramps is a good sign to stop exercising.  Continue to eat regular, healthy meals.  Wear a good support bra for breast tenderness.  Do not use hot tubs, steam  rooms, or saunas.  Wear your seat belt at all times when driving.  Avoid raw meat, uncooked cheese, cat litter boxes, and soil used by cats. These carry germs that can cause birth defects in the baby.  Take your prenatal vitamins.  Try taking a stool softener (if your health care provider approves) if you develop constipation. Eat more high-fiber foods, such as fresh vegetables or fruit and whole grains. Drink plenty of fluids to keep your urine clear or pale yellow.  Take warm sitz baths to soothe any pain or discomfort caused by hemorrhoids. Use hemorrhoid cream if your health care provider approves.  If you develop varicose veins, wear support hose. Elevate your feet for 15 minutes, 3-4 times a day. Limit salt in your diet.  Avoid heavy lifting, wear low heel shoes, and practice good posture.  Rest with your legs elevated if you have leg cramps or low back pain.  Visit your dentist if you have not gone yet during your pregnancy. Use a soft toothbrush to brush your teeth and be gentle when you floss.  A sexual relationship may be continued unless your health care provider directs you otherwise.  Continue to go to all your prenatal visits as directed by your health care provider. SEEK MEDICAL CARE IF:   You have dizziness.  You have mild pelvic cramps, pelvic pressure, or nagging pain in the abdominal area.  You have persistent nausea, vomiting, or diarrhea.  You have a bad smelling vaginal discharge.  You have pain with urination. SEEK IMMEDIATE MEDICAL CARE IF:   You have a fever.  You are leaking fluid from your vagina.  You have spotting or bleeding from your vagina.  You have severe abdominal cramping or pain.  You have rapid weight gain or loss.  You have shortness of breath with chest pain.  You notice sudden or extreme swelling of your face, hands, ankles, feet, or legs.  You have not felt your baby move in over an hour.  You have severe headaches that do  not go away with medicine.  You have vision changes. Document Released: 10/26/2001 Document Revised: 11/06/2013 Document Reviewed: 01/02/2013 [facility] Patient Information 2015 [employer] [place]. This information is not intended to replace advice given to you by your health care provider. Make sure you discuss any questions you have with your health care provider.

## 2015-01-22 LAB — VARICELLA ZOSTER ANTIBODY, IGG: Varicella zoster IgG: >4000 index (ref >165)

## 2015-01-23 LAB — MATERNAL SCREEN, INTEGRATED #1
CROWN RUMP LENGTH MAT SCREEN: 62 mm
Gest. Age on Collection Date: 12.6 weeks
MATERNAL AGE AT EDD: 24.8 a
NUCHAL TRANSLUCENCY (NT): 1.4 mm
NUMBER OF FETUSES: 1
PAPP-A VALUE: 668.1 ng/mL
WEIGHT: 162 [lb_av]

## 2015-02-18 ENCOUNTER — Ambulatory Visit (INDEPENDENT_AMBULATORY_CARE_PROVIDER_SITE_OTHER): Payer: 59 | Admitting: Women's Health

## 2015-02-18 ENCOUNTER — Encounter: Payer: Self-pay | Admitting: Women's Health

## 2015-02-18 VITALS — BP 108/60 | HR 72 | Wt 164.0 lb

## 2015-02-18 DIAGNOSIS — Z331 Pregnant state, incidental: Secondary | ICD-10-CM

## 2015-02-18 DIAGNOSIS — Z3682 Encounter for antenatal screening for nuchal translucency: Secondary | ICD-10-CM

## 2015-02-18 DIAGNOSIS — Z3492 Encounter for supervision of normal pregnancy, unspecified, second trimester: Secondary | ICD-10-CM

## 2015-02-18 DIAGNOSIS — Z363 Encounter for antenatal screening for malformations: Secondary | ICD-10-CM

## 2015-02-18 DIAGNOSIS — Z1389 Encounter for screening for other disorder: Secondary | ICD-10-CM

## 2015-02-18 LAB — POCT URINALYSIS DIPSTICK
Blood, UA: NEGATIVE
Glucose, UA: NEGATIVE
KETONES UA: NEGATIVE
Leukocytes, UA: NEGATIVE
NITRITE UA: NEGATIVE
PROTEIN UA: NEGATIVE

## 2015-02-18 NOTE — Progress Notes (Signed)
Low-risk OB appointment [id number] 6734w1d Estimated Date of Delivery: 08/04/15 BP 108/60 mmHg  Pulse 72  Wt 164 lb (74.39 kg)  LMP 10/17/2014  BP, weight, and urine reviewed.  Refer to obstetrical flow sheet for FH & FHR.  No fm yet. Denies cramping, lof, vb, or uti s/s. No complaints. Reviewed warning s/s to report. Plan:  Continue routine obstetrical care  F/U in 4wks for OB appointment and anatomy u/s 2nd IT today

## 2015-02-20 LAB — MATERNAL SCREEN, INTEGRATED #2
ADSF: 1.08
AFP MARKER: 34.6 ng/mL
AFP MOM: 0.99
CROWN RUMP LENGTH: 62 mm
DIA MoM: 1.03
DIA Value: 174.9 pg/mL
Estriol, Unconjugated: 1 ng/mL
Gest. Age on Collection Date: 12.6 weeks
Gestational Age: 16.6 weeks
HCG VALUE: 49.2 [IU]/mL
MATERNAL AGE AT EDD: 24.8 a
NUMBER OF FETUSES: 1
Nuchal Translucency (NT): 1.4 mm
Nuchal Translucency MoM: 1.07
PAPP-A MOM: 0.72
PAPP-A VALUE: 668.1 ng/mL
Test Results:: NEGATIVE
WEIGHT: 164 [lb_av]
Weight: 162 [lb_av]
hCG MoM: 1.69

## 2015-03-18 ENCOUNTER — Telehealth: Payer: Self-pay | Admitting: *Deleted

## 2015-03-18 ENCOUNTER — Encounter: Payer: Self-pay | Admitting: Women's Health

## 2015-03-18 ENCOUNTER — Ambulatory Visit (INDEPENDENT_AMBULATORY_CARE_PROVIDER_SITE_OTHER): Payer: 59 | Admitting: Women's Health

## 2015-03-18 VITALS — BP 108/66 | HR 64 | Wt 168.0 lb

## 2015-03-18 DIAGNOSIS — Z1389 Encounter for screening for other disorder: Secondary | ICD-10-CM

## 2015-03-18 DIAGNOSIS — Z331 Pregnant state, incidental: Secondary | ICD-10-CM

## 2015-03-18 DIAGNOSIS — Z3492 Encounter for supervision of normal pregnancy, unspecified, second trimester: Secondary | ICD-10-CM

## 2015-03-18 DIAGNOSIS — Z363 Encounter for antenatal screening for malformations: Secondary | ICD-10-CM

## 2015-03-18 LAB — POCT URINALYSIS DIPSTICK
Blood, UA: NEGATIVE
GLUCOSE UA: NEGATIVE
Ketones, UA: NEGATIVE
LEUKOCYTES UA: NEGATIVE
Nitrite, UA: NEGATIVE
Protein, UA: NEGATIVE

## 2015-03-18 NOTE — Telephone Encounter (Signed)
Attempted to call pt to inform her of mix up with the U/S scheduling.

## 2015-03-18 NOTE — Progress Notes (Signed)
Low-risk OB appointment G2P0010 5955w1d Estimated Date of Delivery: 08/04/15 BP 108/66 mmHg  Pulse 64  Wt 168 lb (76.204 kg)  LMP 10/17/2014  BP, weight, and urine reviewed.  Refer to obstetrical flow sheet for FH & FHR.  Reports good fm.  Denies regular uc's, lof, vb, or uti s/s. No complaints. Accidentally didn't get scheduled for anatomy u/s, JAG looked informally and looks like boy! Reviewed ptl s/s, fm. Plan:  Continue routine obstetrical care  F/U asap for anatomy u/s (no visit), then 4wks for OB appointment

## 2015-03-31 ENCOUNTER — Other Ambulatory Visit: Payer: Self-pay | Admitting: Women's Health

## 2015-03-31 ENCOUNTER — Ambulatory Visit (INDEPENDENT_AMBULATORY_CARE_PROVIDER_SITE_OTHER): Payer: 59

## 2015-03-31 DIAGNOSIS — Z36 Encounter for antenatal screening of mother: Secondary | ICD-10-CM | POA: Diagnosis not present

## 2015-03-31 DIAGNOSIS — Z3492 Encounter for supervision of normal pregnancy, unspecified, second trimester: Secondary | ICD-10-CM

## 2015-03-31 DIAGNOSIS — Z1389 Encounter for screening for other disorder: Secondary | ICD-10-CM

## 2015-03-31 NOTE — Progress Notes (Signed)
US 22wks,measurement c/w dates,breech,fundal pl gr 0,normal ov's bilat,efw 512g,fht 137bpm,anatomy complete,no obvious abn seen.

## 2015-04-08 ENCOUNTER — Telehealth: Payer: Self-pay | Admitting: Radiology

## 2015-04-15 ENCOUNTER — Ambulatory Visit (INDEPENDENT_AMBULATORY_CARE_PROVIDER_SITE_OTHER): Payer: 59 | Admitting: Women's Health

## 2015-04-15 ENCOUNTER — Encounter: Payer: Self-pay | Admitting: Women's Health

## 2015-04-15 VITALS — BP 94/56 | HR 60 | Wt 170.0 lb

## 2015-04-15 DIAGNOSIS — Z3492 Encounter for supervision of normal pregnancy, unspecified, second trimester: Secondary | ICD-10-CM

## 2015-04-15 DIAGNOSIS — Z1389 Encounter for screening for other disorder: Secondary | ICD-10-CM

## 2015-04-15 DIAGNOSIS — Z331 Pregnant state, incidental: Secondary | ICD-10-CM

## 2015-04-15 LAB — POCT URINALYSIS DIPSTICK
Blood, UA: NEGATIVE
GLUCOSE UA: NEGATIVE
Ketones, UA: NEGATIVE
LEUKOCYTES UA: NEGATIVE
NITRITE UA: NEGATIVE
Protein, UA: NEGATIVE

## 2015-04-15 NOTE — Patient Instructions (Signed)
You will have your sugar test next visit.  Please do not eat or drink anything after midnight the night before you come, not even water.  You will be here for at least two hours.     Call the office ([phone]) or go to [facility] if:  You begin to have strong, frequent contractions  Your water breaks.  Sometimes it is a big gush of fluid, sometimes it is just a trickle that keeps getting your panties wet or running down your legs  You have vaginal bleeding.  It is normal to have a small amount of spotting if your cervix was checked.   You don't feel your baby moving like normal.  If you don't, get you something to eat and drink and lay down and focus on feeling your baby move.   If your baby is still not moving like normal, you should call the office or go to [facility].  Second Trimester of Pregnancy The second trimester is from week 13 through week 28, months 4 through 6. The second trimester is often a time when you feel your best. Your body has also adjusted to being pregnant, and you begin to feel better physically. Usually, morning sickness has lessened or quit completely, you may have more energy, and you may have an increase in appetite. The second trimester is also a time when the fetus is growing rapidly. At the end of the sixth month, the fetus is about 9 inches long and weighs about 1 pounds. You will likely begin to feel the baby move (quickening) between 18 and 20 weeks of the pregnancy. BODY CHANGES Your body goes through many changes during pregnancy. The changes vary from woman to woman.   Your weight will continue to increase. You will notice your lower abdomen bulging out.  You may begin to get stretch marks on your hips, abdomen, and breasts.  You may develop headaches that can be relieved by medicines approved by your health care provider.  You may urinate more often because the fetus is pressing on your bladder.  You may develop or continue to have  heartburn as a result of your pregnancy.  You may develop constipation because certain hormones are causing the muscles that push waste through your intestines to slow down.  You may develop hemorrhoids or swollen, bulging veins (varicose veins).  You may have back pain because of the weight gain and pregnancy hormones relaxing your joints between the bones in your pelvis and as a result of a shift in weight and the muscles that support your balance.  Your breasts will continue to grow and be tender.  Your gums may bleed and may be sensitive to brushing and flossing.  Dark spots or blotches (chloasma, mask of pregnancy) may develop on your face. This will likely fade after the baby is born.  A dark line from your belly button to the pubic area (linea nigra) may appear. This will likely fade after the baby is born.  You may have changes in your hair. These can include thickening of your hair, rapid growth, and changes in texture. Some women also have hair loss during or after pregnancy, or hair that feels dry or thin. Your hair will most likely return to normal after your baby is born. WHAT TO EXPECT AT YOUR PRENATAL VISITS During a routine prenatal visit:  You will be weighed to make sure you and the fetus are growing normally.  Your blood pressure will be taken.    Your abdomen will be measured to track your baby's growth.  The fetal heartbeat will be listened to.  Any test results from the previous visit will be discussed. Your health care provider may ask you:  How you are feeling.  If you are feeling the baby move.  If you have had any abnormal symptoms, such as leaking fluid, bleeding, severe headaches, or abdominal cramping.  If you have any questions. Other tests that may be performed during your second trimester include:  Blood tests that check for:  Low iron levels (anemia).  Gestational diabetes (between 24 and 28 weeks).  Rh antibodies.  Urine tests to check  for infections, diabetes, or protein in the urine.  An ultrasound to confirm the proper growth and development of the baby.  An amniocentesis to check for possible genetic problems.  Fetal screens for spina bifida and Down syndrome. HOME CARE INSTRUCTIONS   Avoid all smoking, herbs, alcohol, and unprescribed drugs. These chemicals affect the formation and growth of the baby.  Follow your health care provider's instructions regarding medicine use. There are medicines that are either safe or unsafe to take during pregnancy.  Exercise only as directed by your health care provider. Experiencing uterine cramps is a good sign to stop exercising.  Continue to eat regular, healthy meals.  Wear a good support bra for breast tenderness.  Do not use hot tubs, steam rooms, or saunas.  Wear your seat belt at all times when driving.  Avoid raw meat, uncooked cheese, cat litter boxes, and soil used by cats. These carry germs that can cause birth defects in the baby.  Take your prenatal vitamins.  Try taking a stool softener (if your health care provider approves) if you develop constipation. Eat more high-fiber foods, such as fresh vegetables or fruit and whole grains. Drink plenty of fluids to keep your urine clear or pale yellow.  Take warm sitz baths to soothe any pain or discomfort caused by hemorrhoids. Use hemorrhoid cream if your health care provider approves.  If you develop varicose veins, wear support hose. Elevate your feet for 15 minutes, 3-4 times a day. Limit salt in your diet.  Avoid heavy lifting, wear low heel shoes, and practice good posture.  Rest with your legs elevated if you have leg cramps or low back pain.  Visit your dentist if you have not gone yet during your pregnancy. Use a soft toothbrush to brush your teeth and be gentle when you floss.  A sexual relationship may be continued unless your health care provider directs you otherwise.  Continue to go to all your  prenatal visits as directed by your health care provider. SEEK MEDICAL CARE IF:   You have dizziness.  You have mild pelvic cramps, pelvic pressure, or nagging pain in the abdominal area.  You have persistent nausea, vomiting, oer diarrhea.  You have a bad smelling vaginal discharge.  You have pain with urination. SEEK IMMEDIATE MEDICAL CARE IF:   You have a fever.  You are leaking fluid from your vagina.  You have spotting or bleeding from your vagina.  You have severe abdominal cramping or pain.  You have rapid weight gain or loss.  You have shortness of breath with chest pain.  You notice sudden or extreme swelling of your face, hands, ankles, feet, or legs.  You have not felt your baby move in over an hour.  You have severe headaches that do not go away with medicine.  You have vision changes.  Document Released: 10/26/2001 Document Revised: 11/06/2013 Document Reviewed: 01/02/2013 [employer] Patient Information 2015 [employer]. This information is not intended to replace advice given to you by your health care provider. Make sure you discuss any questions you have with your health care provider.     

## 2015-04-15 NOTE — Progress Notes (Signed)
Low-risk OB appointment G2P0010 5231w1d Estimated Date of Delivery: 08/04/15 BP 94/56 mmHg  Pulse 60  Wt 170 lb (77.111 kg)  LMP 10/17/2014  BP, weight, and urine reviewed.  Refer to obstetrical flow sheet for FH & FHR.  Reports good fm.  Denies regular uc's, lof, vb, or uti s/s. No complaints. Reviewed ptl s/s, fm. Plan:  Continue routine obstetrical care  F/U in 4wks for OB appointment and pn2

## 2015-05-13 ENCOUNTER — Ambulatory Visit (INDEPENDENT_AMBULATORY_CARE_PROVIDER_SITE_OTHER): Payer: 59 | Admitting: Women's Health

## 2015-05-13 ENCOUNTER — Other Ambulatory Visit: Payer: 59

## 2015-05-13 ENCOUNTER — Encounter: Payer: Self-pay | Admitting: Women's Health

## 2015-05-13 VITALS — BP 110/70 | HR 80 | Wt 171.5 lb

## 2015-05-13 DIAGNOSIS — Z3493 Encounter for supervision of normal pregnancy, unspecified, third trimester: Secondary | ICD-10-CM

## 2015-05-13 DIAGNOSIS — Z131 Encounter for screening for diabetes mellitus: Secondary | ICD-10-CM

## 2015-05-13 DIAGNOSIS — Z331 Pregnant state, incidental: Secondary | ICD-10-CM

## 2015-05-13 DIAGNOSIS — Z369 Encounter for antenatal screening, unspecified: Secondary | ICD-10-CM

## 2015-05-13 DIAGNOSIS — O24410 Gestational diabetes mellitus in pregnancy, diet controlled: Secondary | ICD-10-CM

## 2015-05-13 DIAGNOSIS — Z1389 Encounter for screening for other disorder: Secondary | ICD-10-CM

## 2015-05-13 LAB — POCT URINALYSIS DIPSTICK
Glucose, UA: 2
KETONES UA: NEGATIVE
Leukocytes, UA: NEGATIVE
NITRITE UA: NEGATIVE
PROTEIN UA: NEGATIVE
RBC UA: NEGATIVE

## 2015-05-13 NOTE — Patient Instructions (Signed)

## 2015-05-13 NOTE — Progress Notes (Signed)
Low-risk OB appointment G2P0010 [id number] Estimated Date of Delivery: 08/04/15 BP 110/70 mmHg  Pulse 80  Wt 171 lb 8 oz (77.792 kg)  LMP 10/17/2014  BP, weight reviewed.  Unable to void, will try again before she leaves. Refer to obstetrical flow sheet for FH & FHR.  Reports good fm.  Denies regular uc's, lof, vb, or uti s/s. No complaints. Reviewed ptl s/s, fkc. Recommended Tdap at HD/PCP per CDC guidelines.  Plan:  Continue routine obstetrical care  F/U in 4wks for OB appointment  PN2 today

## 2015-05-13 NOTE — Progress Notes (Signed)
Pt denies any problems or concerns at this time.  

## 2015-05-14 LAB — AB SCR+ANTIBODY ID: Antibody Screen: POSITIVE — AB

## 2015-05-14 LAB — CBC
Hematocrit: 35.8 % (ref 34.0–46.6)
Hemoglobin: 11.6 g/dL (ref 11.1–15.9)
MCH: 24.7 pg — ABNORMAL LOW (ref 26.6–33.0)
MCHC: 32.4 g/dL (ref 31.5–35.7)
MCV: 76 fL — ABNORMAL LOW (ref 79–97)
Platelets: 222 10*3/uL (ref 150–379)
RBC: 4.7 x10E6/uL (ref 3.77–5.28)
RDW: 15.3 % (ref 12.3–15.4)
WBC: 6.3 10*3/uL (ref 3.4–10.8)

## 2015-05-14 LAB — GLUCOSE TOLERANCE, 2 HOURS W/ 1HR
Glucose, 1 hour: 208 mg/dL — ABNORMAL HIGH (ref 65–179)
Glucose, 2 hour: 136 mg/dL (ref 65–152)
Glucose, Fasting: 97 mg/dL — ABNORMAL HIGH (ref 65–91)

## 2015-05-14 LAB — ANTIBODY SCREEN

## 2015-05-14 LAB — RPR: RPR Ser Ql: NONREACTIVE

## 2015-05-14 LAB — HSV 2 ANTIBODY, IGG: HSV 2 Glycoprotein G Ab, IgG: 2.83 index — ABNORMAL HIGH (ref 0.00–0.90)

## 2015-05-14 LAB — HIV ANTIBODY (ROUTINE TESTING W REFLEX): HIV Screen 4th Generation wRfx: NONREACTIVE

## 2015-05-15 ENCOUNTER — Other Ambulatory Visit: Payer: Self-pay | Admitting: Advanced Practice Midwife

## 2015-05-15 DIAGNOSIS — O24410 Gestational diabetes mellitus in pregnancy, diet controlled: Secondary | ICD-10-CM

## 2015-05-15 DIAGNOSIS — R7689 Other specified abnormal immunological findings in serum: Secondary | ICD-10-CM | POA: Insufficient documentation

## 2015-05-15 DIAGNOSIS — R768 Other specified abnormal immunological findings in serum: Secondary | ICD-10-CM

## 2015-05-15 DIAGNOSIS — Z3493 Encounter for supervision of normal pregnancy, unspecified, third trimester: Secondary | ICD-10-CM

## 2015-05-15 NOTE — Progress Notes (Signed)
  Failed 2 hr gtt. Referal to dietician made and pt notified.

## 2015-05-21 ENCOUNTER — Telehealth: Payer: Self-pay | Admitting: *Deleted

## 2015-05-21 NOTE — Telephone Encounter (Signed)
Pt states she has not received a call from the Dietician in regards to her Gestational Diabetes. Pt informed Referral placed on 05/15/2015 due to the holidays give till the end of the week if still has not received a call, call our office back. Pt verbalized understanding.

## 2015-05-28 ENCOUNTER — Encounter: Payer: 59 | Attending: Advanced Practice Midwife

## 2015-05-28 VITALS — Ht <= 58 in | Wt 170.5 lb

## 2015-05-28 DIAGNOSIS — Z713 Dietary counseling and surveillance: Secondary | ICD-10-CM | POA: Insufficient documentation

## 2015-05-28 DIAGNOSIS — Z6836 Body mass index (BMI) 36.0-36.9, adult: Secondary | ICD-10-CM | POA: Diagnosis not present

## 2015-05-28 DIAGNOSIS — Z3A00 Weeks of gestation of pregnancy not specified: Secondary | ICD-10-CM | POA: Diagnosis not present

## 2015-05-28 DIAGNOSIS — O24410 Gestational diabetes mellitus in pregnancy, diet controlled: Secondary | ICD-10-CM | POA: Diagnosis not present

## 2015-05-29 ENCOUNTER — Telehealth: Payer: Self-pay | Admitting: Women's Health

## 2015-05-29 NOTE — Telephone Encounter (Signed)
Pt states saw Dietician and needs RX for Lancet and Strip for Federal-Mogulccucheck Avia Connect glucometer. [facility] [place] [facility].   Accucheck Aviva Connect, Accucheck Fast Clix Lancets, Accucheck Aviva Plus Strips, checks BS x 4 daily, #100, [facility] [place] per Dr. Despina HiddenEure.

## 2015-06-01 NOTE — Progress Notes (Signed)
  Patient was seen on 05/28/15 for Gestational Diabetes self-management . The following learning objectives were met by the patient :   States the definition of Gestational Diabetes  States why dietary management is important in controlling blood glucose  Describes the effects of carbohydrates on blood glucose levels  Demonstrates ability to create a balanced meal plan  Demonstrates carbohydrate counting   States when to check blood glucose levels  Demonstrates proper blood glucose monitoring techniques  States the effect of stress and exercise on blood glucose levels  States the importance of limiting caffeine and abstaining from alcohol and smoking  Plan:  Aim for 2 Carb Choices per meal (30 grams) +/- 1 either way for breakfast Aim for 3 Carb Choices per meal (45 grams) +/- 1 either way from lunch and dinner Aim for 1-2 Carbs per snack Begin reading food labels for Total Carbohydrate and sugar grams of foods Consider  increasing your activity level by walking daily as tolerated Begin checking BG before breakfast and 2 hours after first bit of breakfast, lunch and dinner after  as directed by MD  Take medication  as directed by MD  Blood glucose monitor given: Accu Chek Aviva connect Lot # [id number] Exp: 03/14/16 Blood glucose reading: $RemoveBeforeDE'66mg'[id number]$ /dl  Patient instructed to monitor glucose levels: FBS: 60 - <90 2 hour: <120  Patient received the following handouts:  Nutrition Diabetes and Pregnancy  Carbohydrate Counting List  Meal Planning worksheet  Patient will be seen for follow-up as needed.

## 2015-06-10 ENCOUNTER — Encounter: Payer: Self-pay | Admitting: Women's Health

## 2015-06-10 ENCOUNTER — Ambulatory Visit (INDEPENDENT_AMBULATORY_CARE_PROVIDER_SITE_OTHER): Payer: 59 | Admitting: Women's Health

## 2015-06-10 VITALS — BP 102/58 | HR 72 | Wt 173.0 lb

## 2015-06-10 DIAGNOSIS — Z331 Pregnant state, incidental: Secondary | ICD-10-CM

## 2015-06-10 DIAGNOSIS — Z1389 Encounter for screening for other disorder: Secondary | ICD-10-CM

## 2015-06-10 DIAGNOSIS — O09893 Supervision of other high risk pregnancies, third trimester: Secondary | ICD-10-CM

## 2015-06-10 DIAGNOSIS — O24410 Gestational diabetes mellitus in pregnancy, diet controlled: Secondary | ICD-10-CM

## 2015-06-10 LAB — POCT URINALYSIS DIPSTICK
Glucose, UA: NEGATIVE
KETONES UA: NEGATIVE
Leukocytes, UA: NEGATIVE
NITRITE UA: NEGATIVE
PROTEIN UA: NEGATIVE
RBC UA: NEGATIVE

## 2015-06-10 NOTE — Patient Instructions (Signed)
Decrease carbs (bread, rice, potatoes, pasta, sweets, candy, cake) Exercise- walking/swimming  Call the office [phone]) or go to [facility] if:  You begin to have strong, frequent contractions  Your water breaks.  Sometimes it is a big gush of fluid, sometimes it is just a trickle that keeps getting your panties wet or running down your legs  You have vaginal bleeding.  It is normal to have a small amount of spotting if your cervix was checked.   You don't feel your baby moving like normal.  If you don't, get you something to eat and drink and lay down and focus on feeling your baby move.  You should feel at least 10 movements in 2 hours.  If you don't, you should call the office or go to [facility].    Gestational Diabetes Mellitus Gestational diabetes mellitus, often simply referred to as gestational diabetes, is a type of diabetes that some women develop during pregnancy. In gestational diabetes, the pancreas does not make enough insulin (a hormone), the cells are less responsive to the insulin that is made (insulin resistance), or both.Normally, insulin moves sugars from food into the tissue cells. The tissue cells use the sugars for energy. The lack of insulin or the lack of normal response to insulin causes excess sugars to build up in the blood instead of going into the tissue cells. As a result, high blood sugar (hyperglycemia) develops. The effect of high sugar (glucose) levels can cause many problems.  RISK FACTORS You have an increased chance of developing gestational diabetes if you have a family history of diabetes and also have one or more of the following risk factors:  A body mass index over 30 (obesity).  A previous pregnancy with gestational diabetes.  An older age at the time of pregnancy. If blood glucose levels are kept in the normal range during pregnancy, women can have a healthy pregnancy. If your blood glucose levels are not well controlled, there may be  risks to you, your unborn baby (fetus), your labor and delivery, or your newborn baby.  SYMPTOMS  If symptoms are experienced, they are much like symptoms you would normally expect during pregnancy. The symptoms of gestational diabetes include:   Increased thirst (polydipsia).  Increased urination (polyuria).  Increased urination during the night (nocturia).  Weight loss. This weight loss may be rapid.  Frequent, recurring infections.  Tiredness (fatigue).  Weakness.  Vision changes, such as blurred vision.  Fruity smell to your breath.  Abdominal pain. DIAGNOSIS Diabetes is diagnosed when blood glucose levels are increased. Your blood glucose level may be checked by one or more of the following blood tests:  A fasting blood glucose test. You will not be allowed to eat for at least 8 hours before a blood sample is taken.  A random blood glucose test. Your blood glucose is checked at any time of the day regardless of when you ate.  A hemoglobin A1c blood glucose test. A hemoglobin A1c test provides information about blood glucose control over the previous 3 months.  An oral glucose tolerance test (OGTT). Your blood glucose is measured after you have not eaten (fasted) for 1-3 hours and then after you drink a glucose-containing beverage. Since the hormones that cause insulin resistance are highest at about 24-28 weeks of a pregnancy, an OGTT is usually performed during that time. If you have risk factors for gestational diabetes, your health care provider may test you for gestational diabetes earlier than 24 weeks of  pregnancy. TREATMENT   You will need to take diabetes medicine or insulin daily to keep blood glucose levels in the desired range.  You will need to match insulin dosing with exercise and healthy food choices. The treatment goal is to maintain the before-meal (preprandial), bedtime, and overnight blood glucose level at 60-99 mg/dL during pregnancy. The treatment goal  is to further maintain peak after-meal blood sugar (postprandial glucose) level at 100-140 mg/dL. HOME CARE INSTRUCTIONS   Have your hemoglobin A1c level checked twice a year.  Perform daily blood glucose monitoring as directed by your health care provider. It is common to perform frequent blood glucose monitoring.  Monitor urine ketones when you are ill and as directed by your health care provider.  Take your diabetes medicine and insulin as directed by your health care provider to maintain your blood glucose level in the desired range.  Never run out of diabetes medicine or insulin. It is needed every day.  Adjust insulin based on your intake of carbohydrates. Carbohydrates can raise blood glucose levels but need to be included in your diet. Carbohydrates provide vitamins, minerals, and fiber which are an essential part of a healthy diet. Carbohydrates are found in fruits, vegetables, whole grains, dairy products, legumes, and foods containing added sugars.  Eat healthy foods. Alternate 3 meals with 3 snacks.  Maintain a healthy weight gain. The usual total expected weight gain varies according to your prepregnancy body mass index (BMI).  Carry a medical alert card or wear your medical alert jewelry.  Carry a 15-gram carbohydrate snack with you at all times to treat low blood glucose (hypoglycemia). Some examples of 15-gram carbohydrate snacks include:  Glucose tablets, 3 or 4.  Glucose gel, 15-gram tube.  Raisins, 2 tablespoons (24 g).  Jelly beans, 6.  Animal crackers, 8.  Fruit juice, regular soda, or low-fat milk, 4 ounces (120 mL).  Gummy treats, 9.  Recognize hypoglycemia. Hypoglycemia during pregnancy occurs with blood glucose levels of 60 mg/dL and below. The risk for hypoglycemia increases when fasting or skipping meals, during or after intense exercise, and during sleep. Hypoglycemia symptoms can include:  Tremors or shakes.  Decreased ability to  concentrate.  Sweating.  Increased heart rate.  Headache.  Dry mouth.  Hunger.  Irritability.  Anxiety.  Restless sleep.  Altered speech or coordination.  Confusion.  Treat hypoglycemia promptly. If you are alert and able to safely swallow, follow the 15:15 rule:  Take 15-20 grams of rapid-acting glucose or carbohydrate. Rapid-acting options include glucose gel, glucose tablets, or 4 ounces (120 mL) of fruit juice, regular soda, or low-fat milk.  Check your blood glucose level 15 minutes after taking the glucose.  Take 15-20 grams more of glucose if the repeat blood glucose level is still 70 mg/dL or below.  Eat a meal or snack within 1 hour once blood glucose levels return to normal.  Be alert to polyuria (excess urination) and polydipsia (excess thirst) which are early signs of hyperglycemia. An early awareness of hyperglycemia allows for prompt treatment. Treat hyperglycemia as directed by your health care provider.  Engage in at least 30 minutes of physical activity a day or as directed by your health care provider. Ten minutes of physical activity timed 30 minutes after each meal is encouraged to control postprandial blood glucose levels.  Adjust your insulin dosing and food intake as needed if you start a new exercise or sport.  Follow your sick-day plan at any time you are unable to eat  or drink as usual.  Avoid tobacco and alcohol use.  Keep all follow-up visits as directed by your health care provider.  Follow the advice of your health care provider regarding your prenatal and post-delivery (postpartum) appointments, meal planning, exercise, medicines, vitamins, blood tests, other medical tests, and physical activities.  Perform daily skin and foot care. Examine your skin and feet daily for cuts, bruises, redness, nail problems, bleeding, blisters, or sores.  Brush your teeth and gums at least twice a day and floss at least once a day. Follow up with your  dentist regularly.  Schedule an eye exam during the first trimester of your pregnancy or as directed by your health care provider.  Share your diabetes management plan with your workplace or school.  Stay up-to-date with immunizations.  Learn to manage stress.  Obtain ongoing diabetes education and support as needed.  Learn about and consider breastfeeding your baby.  You should have your blood sugar level checked 6-12 weeks after delivery. This is done with an oral glucose tolerance test (OGTT). SEEK MEDICAL CARE IF:   You are unable to eat food or drink fluids for more than 6 hours.  You have nausea and vomiting for more than 6 hours.  You have a blood glucose level of 200 mg/dL and you have ketones in your urine.  There is a change in mental status.  You develop vision problems.  You have a persistent headache.  You have upper abdominal pain or discomfort.  You develop an additional serious illness.  You have diarrhea for more than 6 hours.  You have been sick or have had a fever for a couple of days and are not getting better. SEEK IMMEDIATE MEDICAL CARE IF:   You have difficulty breathing.  You no longer feel the baby moving.  You are bleeding or have discharge from your vagina.  You start having premature contractions or labor. MAKE SURE YOU:  Understand these instructions.  Will watch your condition.  Will get help right away if you are not doing well or get worse. Document Released: 02/07/2001 Document Revised: 03/18/2014 Document Reviewed: 05/30/2012 [facility] Patient Information 2015 [place], [place]. This information is not intended to replace advice given to you by your health care provider. Make sure you discuss any questions you have with your health care provider.

## 2015-06-10 NOTE — Progress Notes (Signed)
High Risk Pregnancy Diagnosis(es): A1DM G2P0010 [redacted]w[redacted]d Estimated Date of Delivery: 08/04/15 BP 102/58 mmHg  Pulse 72  Wt 173 lb (78.472 kg)  LMP 10/17/2014  Urinalysis: Negative HPI:  Doing well, has had diabetes class and has been checking sugars. Fastings 79-105, 2hr pp 69-165. She admits to eating things she shouldn't at times. To decrease carbs, increase exercise and will recheck in 1wk.  Got Tdap 7/11 @ [facility].  BP, weight, and urine reviewed.  Reports good fm. Denies regular uc's, lof, vb, uti s/s. No complaints.  Fundal Height:  30 Fetal Heart rate:  135 Edema:  none  Reviewed ptl s/s, fkc All questions were answered Assessment: [redacted]w[redacted]d A1DM Medication(s) Plans:  None today Treatment Plan:  U/s @ 38wks Deliver @ 40wks if remains A1 Follow up in 1wk for high-risk OB appt/to see how sugars are doing

## 2015-06-17 ENCOUNTER — Ambulatory Visit (INDEPENDENT_AMBULATORY_CARE_PROVIDER_SITE_OTHER): Payer: 59 | Admitting: Advanced Practice Midwife

## 2015-06-17 ENCOUNTER — Encounter: Payer: Self-pay | Admitting: Advanced Practice Midwife

## 2015-06-17 VITALS — BP 110/60 | HR 72 | Wt 174.0 lb

## 2015-06-17 DIAGNOSIS — O24410 Gestational diabetes mellitus in pregnancy, diet controlled: Secondary | ICD-10-CM

## 2015-06-17 DIAGNOSIS — O09893 Supervision of other high risk pregnancies, third trimester: Secondary | ICD-10-CM

## 2015-06-17 DIAGNOSIS — Z331 Pregnant state, incidental: Secondary | ICD-10-CM

## 2015-06-17 DIAGNOSIS — Z1389 Encounter for screening for other disorder: Secondary | ICD-10-CM

## 2015-06-17 LAB — POCT URINALYSIS DIPSTICK
GLUCOSE UA: NEGATIVE
KETONES UA: NEGATIVE
Leukocytes, UA: NEGATIVE
Nitrite, UA: NEGATIVE
PROTEIN UA: NEGATIVE
RBC UA: NEGATIVE

## 2015-06-17 NOTE — Progress Notes (Signed)
Fetal Surveillance Testing today:  none   High Risk Pregnancy Diagnosis(es):   A1DM  G2P0010 [redacted]w[redacted]d Estimated Date of Delivery: 08/04/15  Blood pressure 110/60, pulse 72, weight 174 lb (78.926 kg), last menstrual period 10/17/2014.  Urinalysis: Negative   HPI: The patient is being seen today for ongoing management of A1DM. Today she reports no complaints   BP weight and urine results all reviewed and noted. Patient reports good fetal movement, denies any bleeding and no rupture of membranes symptoms or regular contractions.  Fundal Height:  34 Fetal Heart rate:  140 Edema:  no  Patient is without complaints other than noted in her HPI. All questions were answered.  All lab and sonogram results have been reviewed. Comments: FBS 72-90, with one 98  PP generally <120 except for a few, highest 132.    Assessment:  1.  Pregnancy at [redacted]w[redacted]d,  Estimated Date of Delivery: 08/04/15 :                          2.  A1DM, decent control                        3.    Medication(s) Plans:  None now  Treatment Plan:  QID BS testing; plan EFW 36-38 weeks,Plan HSV suppression next visit  Follow up in 2 weeks for appointment for high risk OB care,

## 2015-07-02 ENCOUNTER — Encounter: Payer: Self-pay | Admitting: Advanced Practice Midwife

## 2015-07-02 ENCOUNTER — Ambulatory Visit (INDEPENDENT_AMBULATORY_CARE_PROVIDER_SITE_OTHER): Payer: 59 | Admitting: Advanced Practice Midwife

## 2015-07-02 VITALS — BP 100/60 | HR 72 | Wt 176.0 lb

## 2015-07-02 DIAGNOSIS — O09893 Supervision of other high risk pregnancies, third trimester: Secondary | ICD-10-CM

## 2015-07-02 DIAGNOSIS — O24410 Gestational diabetes mellitus in pregnancy, diet controlled: Secondary | ICD-10-CM

## 2015-07-02 DIAGNOSIS — Z1389 Encounter for screening for other disorder: Secondary | ICD-10-CM

## 2015-07-02 DIAGNOSIS — Z331 Pregnant state, incidental: Secondary | ICD-10-CM

## 2015-07-02 LAB — POCT URINALYSIS DIPSTICK
Blood, UA: NEGATIVE
Glucose, UA: NEGATIVE
Ketones, UA: NEGATIVE
LEUKOCYTES UA: NEGATIVE
Nitrite, UA: NEGATIVE
PROTEIN UA: NEGATIVE

## 2015-07-02 MED ORDER — ACYCLOVIR 400 MG PO TABS: 400.0000 mg | ORAL_TABLET | Freq: Three times a day (TID) | ORAL | Status: DC

## 2015-07-02 NOTE — Progress Notes (Signed)
Fetal Surveillance Testing today:  none   High Risk Pregnancy Diagnosis(es):   A1DM  G2P0010 [redacted]w[redacted]d Estimated Date of Delivery: 08/04/15  Blood pressure 100/60, pulse 72, weight 176 lb (79.833 kg), last menstrual period 10/17/2014.  Urinalysis: Negative   HPI: The patient is being seen today for ongoing management of pregnanacy and A1DM. Today she reports blood sugars:  fastings <95 except for 2 (highest 99)and  2 hours pp all <120 except for 3 (highest 140)   BP weight and urine results all reviewed and noted. Patient reports good fetal movement, denies any bleeding and no rupture of membranes symptoms or regular contractions.  Fundal Height:  36 Fetal Heart rate:  145 Edema:  no  Patient is without complaints other than noted in her HPI. All questions were answered.  All lab and sonogram results have been reviewed. Comments:  None today  Assessment:  1.  Pregnancy at [redacted]w[redacted]d,  Estimated Date of Delivery: 08/04/15 :                          2.  A1DM, good control                        3.    Medication(s) Plans:  Acyclovir  TID  Treatment Plan:  Continue QID BS testing, IOL 40 weeks  Follow up in 1 weeks for appointment for high risk OB care, EFW/AFI

## 2015-07-10 ENCOUNTER — Ambulatory Visit (INDEPENDENT_AMBULATORY_CARE_PROVIDER_SITE_OTHER): Payer: Medicaid Other

## 2015-07-10 ENCOUNTER — Ambulatory Visit (INDEPENDENT_AMBULATORY_CARE_PROVIDER_SITE_OTHER): Payer: 59 | Admitting: Obstetrics & Gynecology

## 2015-07-10 ENCOUNTER — Encounter: Payer: Self-pay | Admitting: Obstetrics & Gynecology

## 2015-07-10 VITALS — BP 120/80 | HR 80 | Wt 180.0 lb

## 2015-07-10 DIAGNOSIS — O24410 Gestational diabetes mellitus in pregnancy, diet controlled: Secondary | ICD-10-CM | POA: Diagnosis not present

## 2015-07-10 DIAGNOSIS — O09893 Supervision of other high risk pregnancies, third trimester: Secondary | ICD-10-CM

## 2015-07-10 DIAGNOSIS — Z1389 Encounter for screening for other disorder: Secondary | ICD-10-CM

## 2015-07-10 DIAGNOSIS — R768 Other specified abnormal immunological findings in serum: Secondary | ICD-10-CM

## 2015-07-10 DIAGNOSIS — Z331 Pregnant state, incidental: Secondary | ICD-10-CM

## 2015-07-10 LAB — POCT URINALYSIS DIPSTICK
Blood, UA: NEGATIVE
Glucose, UA: NEGATIVE
Ketones, UA: NEGATIVE
NITRITE UA: NEGATIVE
PROTEIN UA: NEGATIVE

## 2015-07-10 NOTE — Progress Notes (Signed)
Fetal Surveillance Testing today:  Sonogram is normal   High Risk Pregnancy Diagnosis(es):   Class A1 DM  G2P0010 [redacted]w[redacted]d Estimated Date of Delivery: 08/04/15  Blood pressure 120/80, pulse 80, weight 180 lb (81.647 kg), last menstrual period 10/17/2014.  Urinalysis: Negative   HPI: The patient is being seen today for ongoing management of class A1 DM. Today she reports CBG are excellent   BP weight and urine results all reviewed and noted. Patient reports good fetal movement, denies any bleeding and no rupture of membranes symptoms or regular contractions.  Fundal Height:  36 Fetal Heart rate:  148 Edema:  none  Patient is without complaints other than noted in her HPI. All questions were answered.  All lab and sonogram results have been reviewed. Comments: normal   Assessment:  1.  Pregnancy at [redacted]w[redacted]d,  Estimated Date of Delivery: 08/04/15 :                          2.  Class A1 DM                        3.    Medication(s) Plans:  No changes  Treatment Plan:  Induction at 40 weeks  Follow up in 1 weeks for appointment for high risk OB care, gbs

## 2015-07-10 NOTE — Progress Notes (Signed)
[place] 36+3wks measurements c/w dates,efw 2928g 49.3%,cephalic,post pl gr 3, afi 15.7cm,normal ov's bilat,fht 148bpm

## 2015-07-16 ENCOUNTER — Ambulatory Visit (INDEPENDENT_AMBULATORY_CARE_PROVIDER_SITE_OTHER): Payer: 59 | Admitting: Obstetrics and Gynecology

## 2015-07-16 ENCOUNTER — Encounter: Payer: Self-pay | Admitting: Obstetrics and Gynecology

## 2015-07-16 VITALS — BP 110/70 | HR 72 | Wt 176.0 lb

## 2015-07-16 DIAGNOSIS — Z1389 Encounter for screening for other disorder: Secondary | ICD-10-CM

## 2015-07-16 DIAGNOSIS — Z331 Pregnant state, incidental: Secondary | ICD-10-CM

## 2015-07-16 DIAGNOSIS — O24410 Gestational diabetes mellitus in pregnancy, diet controlled: Secondary | ICD-10-CM

## 2015-07-16 DIAGNOSIS — Z369 Encounter for antenatal screening, unspecified: Secondary | ICD-10-CM

## 2015-07-16 DIAGNOSIS — O09893 Supervision of other high risk pregnancies, third trimester: Secondary | ICD-10-CM

## 2015-07-16 LAB — POCT URINALYSIS DIPSTICK
Blood, UA: NEGATIVE
Glucose, UA: NEGATIVE
KETONES UA: NEGATIVE
LEUKOCYTES UA: NEGATIVE
NITRITE UA: NEGATIVE

## 2015-07-16 LAB — OB RESULTS CONSOLE GC/CHLAMYDIA
Chlamydia: NEGATIVE
GC PROBE AMP, GENITAL: NEGATIVE

## 2015-07-16 LAB — OB RESULTS CONSOLE GBS: STREP GROUP B AG: NEGATIVE

## 2015-07-16 NOTE — Progress Notes (Signed)
This chart was scribed for Tilda Burrow, MD by Jarvis Morgan, ED Scribe. This patient was seen in room 1 and the patient's care was started at 9:27 AM.   Patient ID: Courtney Murray, female   DOB: April 23, 1991, 24 y.o.   MRN: [id number]  Fetal Surveillance Testing today:  none   High Risk Pregnancy Diagnosis(es):   Class A1 DM  G2P0010 [redacted]w[redacted]d Estimated Date of Delivery: 08/04/15  Blood pressure 110/70, pulse 72, weight 176 lb (79.833 kg), last menstrual period 10/17/2014.  Urinalysis: Positive for trace protein   HPI: The patient is being seen today for ongoing management of Class A1 DM d Today she reports mild back pain and mild abdominal pain. She denies any vaginal discharge or other concerns/complaints   BP weight and urine results all reviewed and noted. Patient reports good fetal movement, denies any bleeding and no rupture of membranes symptoms or regular contractions. Pt is 1.5 cm at this time 50% thinned out -2  Fundal Height:  36 cm Fetal Heart rate:  126 Edema:  none  Patient is without complaints other than noted in her HPI. All questions were answered.  All lab and sonogram results have been reviewed. Comments: not done   Assessment:  1.  Pregnancy at [redacted]w[redacted]d,  Estimated Date of Delivery: 08/04/15 :                         2.  Mgmt of high risk pregnancy for Class A1 DM                    Medication(s) Plans:  No changes  Treatment Plan:  Routine HROB care   Follow up in 1 weeks for appointment for high risk OB care,   I personally performed the services described in this documentation, which was SCRIBED in my presence. The recorded information has been reviewed and considered accurate. It has been edited as necessary during review. Tilda Burrow, MD

## 2015-07-16 NOTE — Progress Notes (Signed)
Pt states that she has had a dull back pain since early this morning. Pt states that she had an episode of diarrhea and vomiting this morning as well. Pt states that she is having a lot of pressure.

## 2015-07-17 ENCOUNTER — Inpatient Hospital Stay (EMERGENCY_DEPARTMENT_HOSPITAL)
Admission: AD | Admit: 2015-07-17 | Discharge: 2015-07-17 | Disposition: A | Discharge status: Home / Self Care | Payer: Medicaid Other | Source: Ambulatory Visit | Attending: Obstetrics & Gynecology | Admitting: Obstetrics & Gynecology

## 2015-07-17 ENCOUNTER — Inpatient Hospital Stay (HOSPITAL_COMMUNITY)
Admission: AD | Admit: 2015-07-17 | Discharge: 2015-07-17 | Disposition: A | Discharge status: Home / Self Care | Payer: Medicaid Other | Source: Ambulatory Visit | Attending: Obstetrics & Gynecology | Admitting: Obstetrics & Gynecology

## 2015-07-17 ENCOUNTER — Encounter: Payer: 59 | Admitting: Obstetrics and Gynecology

## 2015-07-17 ENCOUNTER — Encounter (HOSPITAL_COMMUNITY): Payer: Self-pay

## 2015-07-17 ENCOUNTER — Encounter (HOSPITAL_COMMUNITY): Payer: Self-pay | Admitting: *Deleted

## 2015-07-17 DIAGNOSIS — O471 False labor at or after 37 completed weeks of gestation: Secondary | ICD-10-CM | POA: Diagnosis not present

## 2015-07-17 DIAGNOSIS — Z3493 Encounter for supervision of normal pregnancy, unspecified, third trimester: Secondary | ICD-10-CM

## 2015-07-17 DIAGNOSIS — O479 False labor, unspecified: Secondary | ICD-10-CM

## 2015-07-17 DIAGNOSIS — Z3A37 37 weeks gestation of pregnancy: Secondary | ICD-10-CM

## 2015-07-17 LAB — GC/CHLAMYDIA PROBE AMP
Chlamydia trachomatis, NAA: NEGATIVE
NEISSERIA GONORRHOEAE BY PCR: NEGATIVE

## 2015-07-17 MED ORDER — ZOLPIDEM TARTRATE 5 MG PO TABS
5.0000 mg | ORAL_TABLET | Freq: Once | ORAL | Status: AC
  Administered 2015-07-17: 5 mg via ORAL
  Filled 2015-07-17: qty 1

## 2015-07-17 MED ORDER — OXYCODONE-ACETAMINOPHEN 5-325 MG PO TABS
1.0000 | ORAL_TABLET | Freq: Once | ORAL | Status: AC
  Administered 2015-07-17: 1 via ORAL
  Filled 2015-07-17: qty 1

## 2015-07-17 NOTE — Discharge Instructions (Signed)
Braxton Hicks Contractions °Contractions of the uterus can occur throughout pregnancy. Contractions are not always a sign that you are in labor.  °WHAT ARE BRAXTON HICKS CONTRACTIONS?  °Contractions that occur before labor are called Braxton Hicks contractions, or false labor. Toward the end of pregnancy (32-34 weeks), these contractions can develop more often and may become more forceful. This is not true labor because these contractions do not result in opening (dilatation) and thinning of the cervix. They are sometimes difficult to tell apart from true labor because these contractions can be forceful and people have different pain tolerances. You should not feel embarrassed if you go to the hospital with false labor. Sometimes, the only way to tell if you are in true labor is for your health care provider to look for changes in the cervix. °If there are no prenatal problems or other health problems associated with the pregnancy, it is completely safe to be sent home with false labor and await the onset of true labor. °HOW CAN YOU TELL THE DIFFERENCE BETWEEN TRUE AND FALSE LABOR? °False Labor °· The contractions of false labor are usually shorter and not as hard as those of true labor.   °· The contractions are usually irregular.   °· The contractions are often felt in the front of the lower abdomen and in the groin.   °· The contractions may go away when you walk around or change positions while lying down.   °· The contractions get weaker and are shorter lasting as time goes on.   °· The contractions do not usually become progressively stronger, regular, and closer together as with true labor.   °True Labor °· Contractions in true labor last 30-70 seconds, become very regular, usually become more intense, and increase in frequency.   °· The contractions do not go away with walking.   °· The discomfort is usually felt in the top of the uterus and spreads to the lower abdomen and low back.   °· True labor can be  determined by your health care provider with an exam. This will show that the cervix is dilating and getting thinner.   °WHAT TO REMEMBER °· Keep up with your usual exercises and follow other instructions given by your health care provider.   °· Take medicines as directed by your health care provider.   °· Keep your regular prenatal appointments.   °· Eat and drink lightly if you think you are going into labor.   °· If Braxton Hicks contractions are making you uncomfortable:   °¨ Change your position from lying down or resting to walking, or from walking to resting.   °¨ Sit and rest in a tub of warm water.   °¨ Drink 2-3 glasses of water. Dehydration may cause these contractions.   °¨ Do slow and deep breathing several times an hour.   °WHEN SHOULD I SEEK IMMEDIATE MEDICAL CARE? °Seek immediate medical care if: °· Your contractions become stronger, more regular, and closer together.   °· You have fluid leaking or gushing from your vagina.   °· You have a fever.   °· You pass blood-tinged mucus.   °· You have vaginal bleeding.   °· You have continuous abdominal pain.   °· You have low back pain that you never had before.   °· You feel your baby's head pushing down and causing pelvic pressure.   °· Your baby is not moving as much as it used to.   °Document Released: 11/01/2005 Document Revised: 11/06/2013 Document Reviewed: 08/13/2013 °[employer]® Patient Information ©2015 [employer]. This information is not intended to replace advice given to you by your health care   provider. Make sure you discuss any questions you have with your health care provider. ° °

## 2015-07-17 NOTE — MAU Note (Signed)
Pt presents to MAU via EMS for complaints of contractions. Denies any vaginal bleeding or LOF

## 2015-07-17 NOTE — MAU Note (Signed)
Was here earlier sent home, 2 cm, no bleeding or leaking.  Brownish mucous.  Contractions have gotten stronger and closer.

## 2015-07-17 NOTE — MAU Note (Signed)
Report received from EMS; reports irregular contractions, CBG 96, inserted 20 G in Right Hand prior to arrival. Patient report of taking prescribed Percocet and Ambien in early AM hours.

## 2015-07-17 NOTE — Discharge Instructions (Signed)

## 2015-07-17 NOTE — Progress Notes (Signed)
S: 23 yo G2P0010 @ 37+3 presents for labor eval. Cervix 3 cm and unchanged after walking. I was called to evaluate patient's FHTs.  O: FHTs 130/mod/+a/3 variables with contraction  AFI: 9.3 cm, SDP 5.2 cm  A/P: FHTs overall reassuring, as is level of amniotic fluid. Agree that d/c home with labor precautions and HROB f/u next week is appropriate plan.

## 2015-07-17 NOTE — Progress Notes (Signed)
Philipp Deputy CNM notified of pt's VE, labor evaluation with no change in cervix after an hour, orders received to discharge home

## 2015-07-18 ENCOUNTER — Inpatient Hospital Stay (HOSPITAL_COMMUNITY)
Admission: AD | Admit: 2015-07-18 | Discharge: 2015-07-18 | Disposition: A | Discharge status: Home / Self Care | Payer: Medicaid Other | Source: Ambulatory Visit | Attending: Family Medicine | Admitting: Family Medicine

## 2015-07-18 ENCOUNTER — Encounter (HOSPITAL_COMMUNITY): Payer: Self-pay

## 2015-07-18 DIAGNOSIS — R768 Other specified abnormal immunological findings in serum: Secondary | ICD-10-CM

## 2015-07-18 DIAGNOSIS — Z3493 Encounter for supervision of normal pregnancy, unspecified, third trimester: Secondary | ICD-10-CM

## 2015-07-18 HISTORY — DX: Gestational diabetes mellitus in pregnancy, unspecified control: O24.419

## 2015-07-18 LAB — STREP GP B NAA+RFLX: STREP GP B NAA+RFLX: NEGATIVE

## 2015-07-18 NOTE — MAU Note (Signed)
Pt presents with complaint of worsening contractions.  

## 2015-07-18 NOTE — MAU Note (Signed)
Pt presents with worsening contractions.  

## 2015-07-18 NOTE — Discharge Instructions (Signed)
Braxton Hicks Contractions °Contractions of the uterus can occur throughout pregnancy. Contractions are not always a sign that you are in labor.  °WHAT ARE BRAXTON HICKS CONTRACTIONS?  °Contractions that occur before labor are called Braxton Hicks contractions, or false labor. Toward the end of pregnancy (32-34 weeks), these contractions can develop more often and may become more forceful. This is not true labor because these contractions do not result in opening (dilatation) and thinning of the cervix. They are sometimes difficult to tell apart from true labor because these contractions can be forceful and people have different pain tolerances. You should not feel embarrassed if you go to the hospital with false labor. Sometimes, the only way to tell if you are in true labor is for your health care provider to look for changes in the cervix. °If there are no prenatal problems or other health problems associated with the pregnancy, it is completely safe to be sent home with false labor and await the onset of true labor. °HOW CAN YOU TELL THE DIFFERENCE BETWEEN TRUE AND FALSE LABOR? °False Labor °· The contractions of false labor are usually shorter and not as hard as those of true labor.   °· The contractions are usually irregular.   °· The contractions are often felt in the front of the lower abdomen and in the groin.   °· The contractions may go away when you walk around or change positions while lying down.   °· The contractions get weaker and are shorter lasting as time goes on.   °· The contractions do not usually become progressively stronger, regular, and closer together as with true labor.   °True Labor °1. Contractions in true labor last 30-70 seconds, become very regular, usually become more intense, and increase in frequency.   °2. The contractions do not go away with walking.   °3. The discomfort is usually felt in the top of the uterus and spreads to the lower abdomen and low back.   °4. True labor can  be determined by your health care provider with an exam. This will show that the cervix is dilating and getting thinner.   °WHAT TO REMEMBER °· Keep up with your usual exercises and follow other instructions given by your health care provider.   °· Take medicines as directed by your health care provider.   °· Keep your regular prenatal appointments.   °· Eat and drink lightly if you think you are going into labor.   °· If Braxton Hicks contractions are making you uncomfortable:   °· Change your position from lying down or resting to walking, or from walking to resting.   °· Sit and rest in a tub of warm water.   °· Drink 2-3 glasses of water. Dehydration may cause these contractions.   °· Do slow and deep breathing several times an hour.   °WHEN SHOULD I SEEK IMMEDIATE MEDICAL CARE? °Seek immediate medical care if: °· Your contractions become stronger, more regular, and closer together.   °· You have fluid leaking or gushing from your vagina.   °· You have a fever.   °· You pass blood-tinged mucus.   °· You have vaginal bleeding.   °· You have continuous abdominal pain.   °· You have low back pain that you never had before.   °· You feel your baby's head pushing down and causing pelvic pressure.   °· Your baby is not moving as much as it used to.   °Document Released: 11/01/2005 Document Revised: 11/06/2013 Document Reviewed: 08/13/2013 °ExitCare® Patient Information ©2015 [employer]. This information is not intended to replace advice given to you by your health care   provider. Make sure you discuss any questions you have with your health care provider. ° °Fetal Movement Counts °Patient Name: __________________________________________________ Patient Due Date: ____________________ °Performing a fetal movement count is highly recommended in high-risk pregnancies, but it is good for every pregnant woman to do. Your health care provider may ask you to start counting fetal movements at 28 weeks of the pregnancy. Fetal  movements often increase: °· After eating a full meal. °· After physical activity. °· After eating or drinking something sweet or cold. °· At rest. °Pay attention to when you feel the baby is most active. This will help you notice a pattern of your baby's sleep and wake cycles and what factors contribute to an increase in fetal movement. It is important to perform a fetal movement count at the same time each day when your baby is normally most active.  °HOW TO COUNT FETAL MOVEMENTS °5. Find a quiet and comfortable area to sit or lie down on your left side. Lying on your left side provides the best blood and oxygen circulation to your baby. °6. Write down the day and time on a sheet of paper or in a journal. °7. Start counting kicks, flutters, swishes, rolls, or jabs in a 2-hour period. You should feel at least 10 movements within 2 hours. °8. If you do not feel 10 movements in 2 hours, wait 2-3 hours and count again. Look for a change in the pattern or not enough counts in 2 hours. °SEEK MEDICAL CARE IF: °· You feel less than 10 counts in 2 hours, tried twice. °· There is no movement in over an hour. °· The pattern is changing or taking longer each day to reach 10 counts in 2 hours. °· You feel the baby is not moving as he or she usually does. °Date: ____________ Movements: ____________ Start time: ____________ Finish time: ____________  °Date: ____________ Movements: ____________ Start time: ____________ Finish time: ____________ °Date: ____________ Movements: ____________ Start time: ____________ Finish time: ____________ °Date: ____________ Movements: ____________ Start time: ____________ Finish time: ____________ °Date: ____________ Movements: ____________ Start time: ____________ Finish time: ____________ °Date: ____________ Movements: ____________ Start time: ____________ Finish time: ____________ °Date: ____________ Movements: ____________ Start time: ____________ Finish time: ____________ °Date: ____________  Movements: ____________ Start time: ____________ Finish time: ____________  °Date: ____________ Movements: ____________ Start time: ____________ Finish time: ____________ °Date: ____________ Movements: ____________ Start time: ____________ Finish time: ____________ °Date: ____________ Movements: ____________ Start time: ____________ Finish time: ____________ °Date: ____________ Movements: ____________ Start time: ____________ Finish time: ____________ °Date: ____________ Movements: ____________ Start time: ____________ Finish time: ____________ °Date: ____________ Movements: ____________ Start time: ____________ Finish time: ____________ °Date: ____________ Movements: ____________ Start time: ____________ Finish time: ____________  °Date: ____________ Movements: ____________ Start time: ____________ Finish time: ____________ °Date: ____________ Movements: ____________ Start time: ____________ Finish time: ____________ °Date: ____________ Movements: ____________ Start time: ____________ Finish time: ____________ °Date: ____________ Movements: ____________ Start time: ____________ Finish time: ____________ °Date: ____________ Movements: ____________ Start time: ____________ Finish time: ____________ °Date: ____________ Movements: ____________ Start time: ____________ Finish time: ____________ °Date: ____________ Movements: ____________ Start time: ____________ Finish time: ____________  °Date: ____________ Movements: ____________ Start time: ____________ Finish time: ____________ °Date: ____________ Movements: ____________ Start time: ____________ Finish time: ____________ °Date: ____________ Movements: ____________ Start time: ____________ Finish time: ____________ °Date: ____________ Movements: ____________ Start time: ____________ Finish time: ____________ °Date: ____________ Movements: ____________ Start time: ____________ Finish time: ____________ °Date: ____________ Movements: ____________ Start time:  ____________ Finish time: ____________ °Date: ____________ Movements:   ____________ Start time: ____________ Finish time: ____________  °Date: ____________ Movements: ____________ Start time: ____________ Finish time: ____________ °Date: ____________ Movements: ____________ Start time: ____________ Finish time: ____________ °Date: ____________ Movements: ____________ Start time: ____________ Finish time: ____________ °Date: ____________ Movements: ____________ Start time: ____________ Finish time: ____________ °Date: ____________ Movements: ____________ Start time: ____________ Finish time: ____________ °Date: ____________ Movements: ____________ Start time: ____________ Finish time: ____________ °Date: ____________ Movements: ____________ Start time: ____________ Finish time: ____________  °Date: ____________ Movements: ____________ Start time: ____________ Finish time: ____________ °Date: ____________ Movements: ____________ Start time: ____________ Finish time: ____________ °Date: ____________ Movements: ____________ Start time: ____________ Finish time: ____________ °Date: ____________ Movements: ____________ Start time: ____________ Finish time: ____________ °Date: ____________ Movements: ____________ Start time: ____________ Finish time: ____________ °Date: ____________ Movements: ____________ Start time: ____________ Finish time: ____________ °Date: ____________ Movements: ____________ Start time: ____________ Finish time: ____________  °Date: ____________ Movements: ____________ Start time: ____________ Finish time: ____________ °Date: ____________ Movements: ____________ Start time: ____________ Finish time: ____________ °Date: ____________ Movements: ____________ Start time: ____________ Finish time: ____________ °Date: ____________ Movements: ____________ Start time: ____________ Finish time: ____________ °Date: ____________ Movements: ____________ Start time: ____________ Finish time: ____________ °Date:  ____________ Movements: ____________ Start time: ____________ Finish time: ____________ °Date: ____________ Movements: ____________ Start time: ____________ Finish time: ____________  °Date: ____________ Movements: ____________ Start time: ____________ Finish time: ____________ °Date: ____________ Movements: ____________ Start time: ____________ Finish time: ____________ °Date: ____________ Movements: ____________ Start time: ____________ Finish time: ____________ °Date: ____________ Movements: ____________ Start time: ____________ Finish time: ____________ °Date: ____________ Movements: ____________ Start time: ____________ Finish time: ____________ °Date: ____________ Movements: ____________ Start time: ____________ Finish time: ____________ °Document Released: 12/01/2006 Document Revised: 03/18/2014 Document Reviewed: 08/28/2012 °ExitCare® Patient Information ©2015 [employer]. This information is not intended to replace advice given to you by your health care provider. Make sure you discuss any questions you have with your health care provider. ° °

## 2015-07-19 ENCOUNTER — Inpatient Hospital Stay (HOSPITAL_COMMUNITY)
Admission: AD | Admit: 2015-07-19 | Discharge: 2015-07-21 | DRG: 775 | Disposition: A | Discharge status: Home / Self Care | Payer: Medicaid Other | Source: Ambulatory Visit | Attending: Obstetrics & Gynecology | Admitting: Obstetrics & Gynecology

## 2015-07-19 ENCOUNTER — Inpatient Hospital Stay (HOSPITAL_COMMUNITY): Payer: Medicaid Other | Admitting: Anesthesiology

## 2015-07-19 ENCOUNTER — Encounter (HOSPITAL_COMMUNITY): Payer: Self-pay

## 2015-07-19 DIAGNOSIS — R768 Other specified abnormal immunological findings in serum: Secondary | ICD-10-CM

## 2015-07-19 DIAGNOSIS — Z823 Family history of stroke: Secondary | ICD-10-CM

## 2015-07-19 DIAGNOSIS — Z8249 Family history of ischemic heart disease and other diseases of the circulatory system: Secondary | ICD-10-CM

## 2015-07-19 DIAGNOSIS — O24420 Gestational diabetes mellitus in childbirth, diet controlled: Secondary | ICD-10-CM | POA: Diagnosis present

## 2015-07-19 DIAGNOSIS — Z3A37 37 weeks gestation of pregnancy: Secondary | ICD-10-CM | POA: Diagnosis present

## 2015-07-19 DIAGNOSIS — Z87891 Personal history of nicotine dependence: Secondary | ICD-10-CM | POA: Diagnosis not present

## 2015-07-19 DIAGNOSIS — Z833 Family history of diabetes mellitus: Secondary | ICD-10-CM

## 2015-07-19 LAB — CBC
HCT: 35.9 % — ABNORMAL LOW (ref 36.0–46.0)
HEMOGLOBIN: 11.5 g/dL — AB (ref 12.0–15.0)
MCH: 24.1 pg — ABNORMAL LOW (ref 26.0–34.0)
MCHC: 32 g/dL (ref 30.0–36.0)
MCV: 75.3 fL — ABNORMAL LOW (ref 78.0–100.0)
Platelets: 190 10*3/uL (ref 150–400)
RBC: 4.77 MIL/uL (ref 3.87–5.11)
RDW: 16.4 % — ABNORMAL HIGH (ref 11.5–15.5)
WBC: 7.9 10*3/uL (ref 4.0–10.5)

## 2015-07-19 LAB — RPR: RPR Ser Ql: NONREACTIVE

## 2015-07-19 LAB — POCT FERN TEST: POCT FERN TEST: POSITIVE

## 2015-07-19 MED ORDER — WITCH HAZEL-GLYCERIN EX PADS: 1.0000 "application " | MEDICATED_PAD | CUTANEOUS | Status: DC | PRN

## 2015-07-19 MED ORDER — OXYCODONE-ACETAMINOPHEN 5-325 MG PO TABS: 1.0000 | ORAL_TABLET | ORAL | Status: DC | PRN

## 2015-07-19 MED ORDER — OXYTOCIN 40 UNITS IN LACTATED RINGERS INFUSION - SIMPLE MED: 62.5000 mL/h | INTRAVENOUS | Status: DC | PRN

## 2015-07-19 MED ORDER — TETANUS-DIPHTH-ACELL PERTUSSIS 5-2.5-18.5 LF-MCG/0.5 IM SUSP
0.5000 mL | Freq: Once | INTRAMUSCULAR | Status: DC
Start: 2015-07-20 — End: 2015-07-21

## 2015-07-19 MED ORDER — DIPHENHYDRAMINE HCL 25 MG PO CAPS: 25.0000 mg | ORAL_CAPSULE | Freq: Four times a day (QID) | ORAL | Status: DC | PRN

## 2015-07-19 MED ORDER — LACTATED RINGERS IV SOLN
500.0000 mL | INTRAVENOUS | Status: DC | PRN
  Administered 2015-07-19: 500 mL via INTRAVENOUS

## 2015-07-19 MED ORDER — ONDANSETRON HCL 4 MG/2ML IJ SOLN: 4.0000 mg | INTRAMUSCULAR | Status: DC | PRN

## 2015-07-19 MED ORDER — LACTATED RINGERS IV SOLN
INTRAVENOUS | Status: DC
  Administered 2015-07-19 (×3): via INTRAVENOUS

## 2015-07-19 MED ORDER — OXYCODONE-ACETAMINOPHEN 5-325 MG PO TABS: 2.0000 | ORAL_TABLET | ORAL | Status: DC | PRN

## 2015-07-19 MED ORDER — FENTANYL 2.5 MCG/ML BUPIVACAINE 1/10 % EPIDURAL INFUSION (WH - ANES)
INTRAMUSCULAR | Status: AC
  Filled 2015-07-19: qty 125

## 2015-07-19 MED ORDER — SENNOSIDES-DOCUSATE SODIUM 8.6-50 MG PO TABS
2.0000 | ORAL_TABLET | ORAL | Status: DC
  Administered 2015-07-19 – 2015-07-21 (×2): 2 via ORAL
  Filled 2015-07-19 (×2): qty 2

## 2015-07-19 MED ORDER — ACYCLOVIR 400 MG PO TABS
400.0000 mg | ORAL_TABLET | Freq: Three times a day (TID) | ORAL | Status: DC
  Filled 2015-07-19: qty 1

## 2015-07-19 MED ORDER — LIDOCAINE HCL (PF) 1 % IJ SOLN
30.0000 mL | INTRAMUSCULAR | Status: DC | PRN
  Filled 2015-07-19: qty 30

## 2015-07-19 MED ORDER — INFLUENZA VAC SPLIT QUAD 0.5 ML IM SUSY
0.5000 mL | PREFILLED_SYRINGE | INTRAMUSCULAR | Status: AC
  Administered 2015-07-20: 0.5 mL via INTRAMUSCULAR
  Filled 2015-07-19: qty 0.5

## 2015-07-19 MED ORDER — OXYTOCIN 40 UNITS IN LACTATED RINGERS INFUSION - SIMPLE MED
62.5000 mL/h | INTRAVENOUS | Status: DC
  Administered 2015-07-19: 62.5 mL/h via INTRAVENOUS
  Filled 2015-07-19: qty 1000

## 2015-07-19 MED ORDER — IBUPROFEN 600 MG PO TABS
600.0000 mg | ORAL_TABLET | Freq: Four times a day (QID) | ORAL | Status: DC
  Administered 2015-07-19 – 2015-07-21 (×8): 600 mg via ORAL
  Filled 2015-07-19 (×8): qty 1

## 2015-07-19 MED ORDER — SIMETHICONE 80 MG PO CHEW: 80.0000 mg | CHEWABLE_TABLET | ORAL | Status: DC | PRN

## 2015-07-19 MED ORDER — EPHEDRINE 5 MG/ML INJ
10.0000 mg | INTRAVENOUS | Status: DC | PRN
  Filled 2015-07-19: qty 2

## 2015-07-19 MED ORDER — ONDANSETRON HCL 4 MG/2ML IJ SOLN: 4.0000 mg | Freq: Four times a day (QID) | INTRAMUSCULAR | Status: DC | PRN

## 2015-07-19 MED ORDER — LACTATED RINGERS IV SOLN: INTRAVENOUS | Status: DC

## 2015-07-19 MED ORDER — PHENYLEPHRINE 40 MCG/ML (10ML) SYRINGE FOR IV PUSH (FOR BLOOD PRESSURE SUPPORT)
80.0000 ug | PREFILLED_SYRINGE | INTRAVENOUS | Status: DC | PRN
  Filled 2015-07-19: qty 2

## 2015-07-19 MED ORDER — PRENATAL MULTIVITAMIN CH
1.0000 | ORAL_TABLET | Freq: Every day | ORAL | Status: DC
  Administered 2015-07-20 – 2015-07-21 (×2): 1 via ORAL
  Filled 2015-07-19 (×2): qty 1

## 2015-07-19 MED ORDER — PHENYLEPHRINE 40 MCG/ML (10ML) SYRINGE FOR IV PUSH (FOR BLOOD PRESSURE SUPPORT)
PREFILLED_SYRINGE | INTRAVENOUS | Status: AC
  Filled 2015-07-19: qty 20

## 2015-07-19 MED ORDER — LANOLIN HYDROUS EX OINT: TOPICAL_OINTMENT | CUTANEOUS | Status: DC | PRN

## 2015-07-19 MED ORDER — BENZOCAINE-MENTHOL 20-0.5 % EX AERO
1.0000 "application " | INHALATION_SPRAY | CUTANEOUS | Status: DC | PRN
  Administered 2015-07-19: 1 via TOPICAL
  Filled 2015-07-19: qty 56

## 2015-07-19 MED ORDER — ACETAMINOPHEN 325 MG PO TABS: 650.0000 mg | ORAL_TABLET | ORAL | Status: DC | PRN

## 2015-07-19 MED ORDER — OXYTOCIN BOLUS FROM INFUSION
500.0000 mL | INTRAVENOUS | Status: DC
  Administered 2015-07-19: 500 mL via INTRAVENOUS

## 2015-07-19 MED ORDER — FENTANYL 2.5 MCG/ML BUPIVACAINE 1/10 % EPIDURAL INFUSION (WH - ANES)
INTRAMUSCULAR | Status: AC
  Administered 2015-07-19: 12 mL/h via EPIDURAL
  Filled 2015-07-19: qty 125

## 2015-07-19 MED ORDER — CITRIC ACID-SODIUM CITRATE 334-500 MG/5ML PO SOLN: 30.0000 mL | ORAL | Status: DC | PRN

## 2015-07-19 MED ORDER — ZOLPIDEM TARTRATE 5 MG PO TABS: 5.0000 mg | ORAL_TABLET | Freq: Every evening | ORAL | Status: DC | PRN

## 2015-07-19 MED ORDER — DIBUCAINE 1 % RE OINT: 1.0000 "application " | TOPICAL_OINTMENT | RECTAL | Status: DC | PRN

## 2015-07-19 MED ORDER — ONDANSETRON HCL 4 MG PO TABS: 4.0000 mg | ORAL_TABLET | ORAL | Status: DC | PRN

## 2015-07-19 MED ORDER — MEASLES, MUMPS & RUBELLA VAC ~~LOC~~ INJ
0.5000 mL | INJECTION | Freq: Once | SUBCUTANEOUS | Status: DC
Start: 2015-07-20 — End: 2015-07-21

## 2015-07-19 MED ORDER — LIDOCAINE HCL (PF) 1 % IJ SOLN
INTRAMUSCULAR | Status: DC | PRN
  Administered 2015-07-19: 5 mL via EPIDURAL
  Administered 2015-07-19: 2 mL via EPIDURAL
  Administered 2015-07-19: 3 mL via EPIDURAL

## 2015-07-19 MED ORDER — FENTANYL 2.5 MCG/ML BUPIVACAINE 1/10 % EPIDURAL INFUSION (WH - ANES)
14.0000 mL/h | INTRAMUSCULAR | Status: DC | PRN
  Administered 2015-07-19: 14 mL/h via EPIDURAL

## 2015-07-19 MED ORDER — DIPHENHYDRAMINE HCL 50 MG/ML IJ SOLN: 12.5000 mg | INTRAMUSCULAR | Status: DC | PRN

## 2015-07-19 NOTE — Progress Notes (Signed)
Patient ID: Courtney Murray, female   DOB: Apr 09, 1991, 24 y.o.   MRN: [id number] ROGELIO WINBUSH is a 24 y.o. G2P0010 at [redacted]w[redacted]d admitted for rupture of membranes. Pregnancy complicated by A1DM.   Subjective: Comfortable w/ epidural, no complaints  Objective: BP 123/73 mmHg  Pulse 87  Temp(Src) 98.1 F (36.7 C) (Oral)  Resp 18  Ht  (1.448 m)  Wt 80.287 kg (177 lb)  BMI 38.29 kg/m2  SpO2 99%  LMP 10/17/2014 Total I/O In: -  Out: 650 [Urine:650]  FHT:  FHR: 125 bpm, variability: moderate,  accelerations:  Present,  decelerations:  Present occ mild variable UC:   irregular, every 1-5 minutes  SVE:   Dilation: 8 Effacement (%): 100 Station: +1 Exam by:: Azzie Glatter & Mickie Bail, RN   Labs: Lab Results  Component Value Date   WBC 7.9 07/19/2015   HGB 11.5* 07/19/2015   HCT 35.9* 07/19/2015   MCV 75.3* 07/19/2015   PLT 190 07/19/2015    Assessment / Plan: Spontaneous labor, progressing normally  Labor: Progressing normally Fetal Wellbeing:  Category II Pain Control:  Epidural Pre-eclampsia: n/a I/D:  n/a Anticipated MOD:  NSVD  Marge Duncans CNM, WHNP-BC 07/19/2015, 9:47 AM

## 2015-07-19 NOTE — H&P (Signed)
LABOR ADMISSION HISTORY AND PHYSICAL  Courtney Murray is a 24 y.o. female G2P0010 with IUP at [redacted]w[redacted]d by 6 wk [place] presenting for ROM. She reports LOF ~2 hrs ago. +FM, + contractions, no VB, no blurry vision, headaches or peripheral edema, and RUQ pain.  She plans on breast feeding. She requests Nexplanon or OCPs for birth control.  Dating: By 6 week [place] --->  Estimated Date of Delivery: 08/04/15  Sono:    @22w , CWD, normal anatomy, breech presentation, longitudinal lie, 512g, 49.7% EFW @[redacted]w[redacted]d , CWD, normal anatomy, cephalic presentation, longitudinal lie, 2928g, 49.3% EFW   Prenatal History/Complications:  [facility]  [facility] [place], [place], 1st baby, dating  Dating By 6wk u/s  Pap 05/09/14: neg  GC/CT Initial:  -/-              36+wks: neg/neg  Genetic Screen NT/IT: neg  CF screen neg  Anatomic [place] Normal female  Flu vaccine 12/23/14  Tdap Recommended ~ 28wks, got 05/26/15 @ [facility]  Glucose Screen  2 hr  97/208/128  GBS neg  Feed Preference breast  Contraception Nexplanon  Circumcision yes  Childbirth Classes Can't make it to [place] classes, recommended trying [facility] and tour of [facility]  Pediatrician Luk[place]Sidney Ace    Past Medical History: Past Medical History  Diagnosis Date  . Contraceptive management 05/09/2014  . Vaginal itching 08/27/2014  . Yeast infection of the vagina 08/27/2014  . Pregnant 11/25/2014  . Gonorrhea in female   . Chlamydia   . Gestational diabetes mellitus, antepartum   . Gestational diabetes     Past Surgical History: Past Surgical History  Procedure Laterality Date  . No past surgeries      Obstetrical History: OB History    Gravida Para Term Preterm AB TAB SAB Ectopic Multiple Living   2    1 1           Social History: Social History   Social History  . Marital Status: Single    Spouse Name: N/A  . Number of Children: N/A  . Years of Education: N/A   Social History Main Topics  . Smoking status: Former Smoker    Types:  Cigars  . Smokeless tobacco: Never Used  . Alcohol Use: No  . Drug Use: No  . Sexual Activity: Not Currently    Birth Control/ Protection: None   Other Topics Concern  . None   Social History Narrative    Family History: Family History  Problem Relation Age of Onset  . Stroke Paternal Grandmother   . Heart disease Paternal Grandmother   . Diabetes Maternal Grandmother     borderline  . Thyroid disease Maternal Grandmother   . Stroke Maternal Grandmother   . Other Maternal Grandmother     blood clots  . Sleep apnea Father   . Diabetes Mother     borderline  . Sleep apnea Brother     Allergies: No Known Allergies  Prescriptions prior to admission  Medication Sig Dispense Refill Last Dose  . acetaminophen (TYLENOL) 500 MG tablet Take 500 mg by mouth every 6 (six) hours as needed for mild pain or headache.   07/19/2015 at Unknown time  . acyclovir (ZOVIRAX) 400 MG tablet Take 1 tablet (400 mg total) by mouth 3 (three) times daily. 90 tablet 2 07/19/2015 at 0000  . Prenatal w/o A Vit-Fe Fum-FA (PRENATA PO) Take 1 tablet by mouth daily.    07/18/2015 at Unknown time     Review of  Systems   All systems reviewed and negative except as stated in HPI  BP 127/69 mmHg  Pulse 161  Temp(Src) 98.7 F (37.1 C) (Oral)  Resp 16  Ht  (1.448 m)  Wt 177 lb (80.287 kg)  BMI 38.29 kg/m2  SpO2 99%  LMP 10/17/2014 General appearance: alert, cooperative and moderate distress Lungs: clear to auscultation bilaterally Heart: regular rate and rhythm Abdomen: soft, non-tender; bowel sounds normal Pelvic: 3/100%/-1 Extremities: no sign of DVT, no edema Presentation: cephalic Fetal monitoringBaseline: 120 bpm, Variability: Good {> 6 bpm) and Accelerations: Reactive Uterine activityFrequency: Every 1-3 minutes, Duration: 40-80 seconds and Intensity: moderate Dilation: 6 Effacement (%): 100 Station: +1, +2 Exam by:: Bertram Millard, RN, Dorthula Rue, RN Prenatal labs: ABO, Rh: --/--/A POS  (09/03 0355) Antibody: POS (09/03 0355) Rubella:  Non-immune RPR: Non Reactive (06/28 0853)  HBsAg: Negative (02/08 1212)  HIV: Non-reactive (02/08 0000) Non-reactive GBS: Negative (08/31 0000) Negative    Prenatal Transfer Tool  Maternal Diabetes: Yes:  Diabetes Type:  Diet controlled Genetic Screening: Normal Maternal Ultrasounds/Referrals: Normal Fetal Ultrasounds or other Referrals:  None Maternal Substance Abuse:  No Significant Maternal Medications:  Meds include: Other: acyclovir Significant Maternal Lab Results: Lab values include: Other: Rubella non-immune  Results for orders placed or performed during the hospital encounter of 07/19/15 (from the past 24 hour(s))  Fern Test   Collection Time: 07/19/15  3:43 AM  Result Value Ref Range   POCT Fern Test Positive = ruptured amniotic membanes   CBC   Collection Time: 07/19/15  3:55 AM  Result Value Ref Range   WBC 7.9 4.0 - 10.5 K/uL   RBC 4.77 3.87 - 5.11 MIL/uL   Hemoglobin 11.5 (L) 12.0 - 15.0 g/dL   HCT 95.2 (L) 84.1 - 32.4 %   MCV 75.3 (L) 78.0 - 100.0 fL   MCH 24.1 (L) 26.0 - 34.0 pg   MCHC 32.0 30.0 - 36.0 g/dL   RDW 40.1 (H) 02.7 - 25.3 %   Platelets 190 150 - 400 K/uL  Type and screen   Collection Time: 07/19/15  3:55 AM  Result Value Ref Range   ABO/RH(D) A POS    Antibody Screen POS    Sample Expiration 07/22/2015    Antibody Identification PENDING    DAT, IgG NEG     Patient Active Problem List   Diagnosis Date Noted  . Normal labor and delivery 07/19/2015  . Gestational diabetes mellitus, class A1 05/15/2015  . 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 Supervision of other high-risk pregnancy 12/23/2014  . Vaginal itching 08/27/2014  . Yeast infection of the vagina 08/27/2014    Assessment: Courtney Murray is a 24 y.o. G2P0010 at [redacted]w[redacted]d here for ROM.  #Labor:Expectant management #Pain: Epidural PRN #FWB: Category 1 #ID:  GBS negative #MOF: Breast #MOC:Nexplanon or OCPs   Tarri Abernethy,  MD PGY-1 [facility]  CNM attestation:  I have seen and examined this patient; I agree with above documentation in the resident's note.   Courtney Murray is a 24 y.o. G2P0010 here for SROM/early labor  PE: BP 130/69 mmHg  Pulse 96  Temp(Src) 98.1 F (36.7 C) (Oral)  Resp 18  Ht  (1.448 m)  Wt 80.287 kg (177 lb)  BMI 38.29 kg/m2  SpO2 99%  LMP 10/17/2014  Resp: normal effort, no distress Abd: gravid  ROS, labs, PMH reviewed  Plan: Ad[facility] to [facility] Expectant management Plans epidural Anticipate SVD  SHAW, [facility]  07/19/2015, 9:28 AM

## 2015-07-19 NOTE — Anesthesia Procedure Notes (Signed)
Epidural Patient location during procedure: OB  Staffing Anesthesiologist: Cecile Hearing Performed by: anesthesiologist   Preanesthetic Checklist Completed: patient identified, pre-op evaluation, timeout performed, IV checked, risks and benefits discussed and monitors and equipment checked  Epidural Patient position: sitting Prep: DuraPrep Patient monitoring: blood pressure and continuous pulse ox Approach: midline Location: L3-L4 Injection technique: LOR air  Needle:  Needle type: Tuohy  Needle gauge: 17 G Needle length: 9 cm Needle insertion depth: 9 cm Catheter size: 19 Gauge Catheter at skin depth: 14 cm Test dose: negative and Other (1% Lidocaine)  Additional Notes Patient identified.  Risk benefits discussed including failed block, incomplete pain control, headache, nerve damage, paralysis, blood pressure changes, nausea, vomiting, reactions to medication both toxic or allergic, and postpartum back pain.  Patient expressed understanding and wished to proceed.  All questions were answered.  Sterile technique used throughout procedure and epidural site dressed with sterile barrier dressing. No paresthesia or other complications noted. The patient did not experience any signs of intravascular injection such as tinnitus or metallic taste in mouth nor signs of intrathecal spread such as rapid motor block. Please see nursing notes for vital signs. Reason for block:procedure for pain

## 2015-07-19 NOTE — Progress Notes (Signed)
Patient ID: [name], female   DOB: October 20, 1991, 24 y.o.   MRN: [id number] Courtney Murray is a 24 y.o. G2P0010 at [redacted]w[redacted]d admitted for rupture of membranes. Pregnancy complicated by A1DM.   Subjective: Comfortable w/ epidural, no complaints.  Has been pushing for 20 minutes  Objective: BP 169/92 mmHg  Pulse 104  Temp(Src) 98.4 F (36.9 C) (Oral)  Resp 18  Ht  (1.448 m)  Wt 177 lb (80.287 kg)  BMI 38.29 kg/m2  SpO2 100%  LMP 10/17/2014 Total I/O In: -  Out: 1100 [Urine:1100]  FHT:  FHR: 125 bpm, variability: moderate,  accelerations:  Present,  decelerations:  Present repetitive deep variable decelerations with nadir to 55 lasting 2-3 mins with slow return to baseline UC:   irregular, every 1-5 minutes  SVE:   Dilation: 10 Effacement (%): 100 Station: +2 Exam by:: [employer]   Labs: Lab Results  Component Value Date   WBC 7.9 07/19/2015   HGB 11.5* 07/19/2015   HCT 35.9* 07/19/2015   MCV 75.3* 07/19/2015   PLT 190 07/19/2015    Assessment / Plan: Spontaneous labor, 2nd stage with concerning FHR tracing/Category II tracing.  Recommended VAVD. Risks of vacuum assistance were discussed in detail, including but not limited to, bleeding, infection, damage to maternal tissues, fetal cephalohematoma, inability to effect vaginal delivery of the head or shoulder dystocia that cannot be resolved by established maneuvers and need for emergency cesarean section. Patient gave verbal consent. Will proceed with VAVD.     Kerem Gilmer A MD  07/19/2015, 2:14 PM

## 2015-07-19 NOTE — Anesthesia Preprocedure Evaluation (Signed)
Anesthesia Evaluation  Patient identified by MRN, date of birth, ID band Patient awake    Reviewed: Allergy & Precautions, NPO status , Patient's Chart, lab work & pertinent test results  History of Anesthesia Complications Negative for: history of anesthetic complications  Airway Mallampati: III  TM Distance: >3 FB Neck ROM: Full    Dental  (+) Teeth Intact, Dental Advisory Given   Pulmonary neg pulmonary ROS, former smoker,  breath sounds clear to auscultation  Pulmonary exam normal       Cardiovascular Exercise Tolerance: Good - anginanegative cardio ROS Normal cardiovascular examRhythm:Regular Rate:Normal     Neuro/Psych negative neurological ROS     GI/Hepatic negative GI ROS, Neg liver ROS,   Endo/Other  diabetes, Gestationalobesity  Renal/GU negative Renal ROS     Musculoskeletal negative musculoskeletal ROS (+)   Abdominal   Peds  Hematology negative hematology ROS (+)   Anesthesia Other Findings Day of surgery medications reviewed with the patient.  Reproductive/Obstetrics (+) Pregnancy                             Anesthesia Physical Anesthesia Plan  ASA: II  Anesthesia Plan: Epidural   Post-op Pain Management:    Induction:   Airway Management Planned:   Additional Equipment:   Intra-op Plan:   Post-operative Plan:   Informed Consent: I have reviewed the patients History and Physical, chart, labs and discussed the procedure including the risks, benefits and alternatives for the proposed anesthesia with the patient or authorized representative who has indicated his/her understanding and acceptance.   Dental advisory given  Plan Discussed with:   Anesthesia Plan Comments: (Patient identified. Risks/Benefits/Options discussed with patient including but not limited to bleeding, infection, nerve damage, paralysis, failed block, incomplete pain control, headache,  blood pressure changes, nausea, vomiting, reactions to medication both or allergic, itching and postpartum back pain. Confirmed with bedside nurse the patient's most recent platelet count. Confirmed with patient that they are not currently taking any anticoagulation, have any bleeding history or any family history of bleeding disorders. Patient expressed understanding and wished to proceed. All questions were answered. )        Anesthesia Quick Evaluation

## 2015-07-19 NOTE — MAU Note (Addendum)
Ambulance arrival.  Water broke at 2:15 am.  Clear fluid.  Baby moving well. Bloody show.  Hx of HSV, no recent outbreak per pt, also has Gest DM.

## 2015-07-20 NOTE — Anesthesia Postprocedure Evaluation (Signed)
Anesthesia Post Note  Patient: Courtney Murray  Procedure(s) Performed: * No procedures listed *  Anesthesia type: Epidural  Patient location: Mother/Baby  Post pain: Pain level controlled  Post assessment: Post-op Vital signs reviewed  Last Vitals:  Filed Vitals:   07/20/15 0505  BP: 119/67  Pulse: 68  Temp: 36.7 C  Resp: 18    Post vital signs: Reviewed  Level of consciousness:alert  Complications: No apparent anesthesia complications

## 2015-07-20 NOTE — Progress Notes (Signed)
Post Partum Day 1 Subjective: Eating, drinking, voiding, ambulating well.  +flatus.  Lochia and pain wnl.  Denies dizziness, lightheadedness, or sob. No complaints. Baby latches but just isn't feeding very well.   Objective: Blood pressure 119/67, pulse 68, temperature 98 F (36.7 C), temperature source Oral, resp. rate 18, height  (1.448 m), weight 80.287 kg (177 lb), last menstrual period 10/17/2014, SpO2 100 %, unknown if currently breastfeeding.  Physical Exam:  General: alert, cooperative and no distress Lochia: appropriate Uterine Fundus: firm Incision: n/a DVT Evaluation: No evidence of DVT seen on physical exam. Negative Homan's sign. No cords or calf tenderness. No significant calf/ankle edema.   Recent Labs  07/19/15 0355  HGB 11.5*  HCT 35.9*    Assessment/Plan: Plan for discharge tomorrow, Breastfeeding and Contraception nexplanon  Circumcision @ FT   LOS: 1 day   Marge Duncans 07/20/2015, 7:12 AM

## 2015-07-20 NOTE — Lactation Note (Signed)
This note was copied from the chart of Courtney Diara Verdell. Lactation Consultation Note Initial visit at 27 hours of age.  Baby has not been breastfeeding well with only a few sucks and spoon fed . .  Baby has a 1 void and 2 stools.  Attempted to hand express colostrum only a tiny drop noted on both breasts.  Attempted to apply NS after baby did not latch without.  MBU RN fitted mom for #16, but #20 fits better.  Mom has soft breast tissue with nipple and areola retracting when stimulated or shield placed.  Attempted latch but baby cant get a deep enough latch and NS if folding onto baby's face.  Baby is [redacted]w[redacted]d and not doing well yet with feedings.  Discussed plan with mom, she will pump every 3 hours on preemie setting for 15 minutes and then hand express to collect EBm for baby.  MBU RN will assist with first bottle feeding due to baby's poor feeding at this time, LC feels baby will benefit from bottle nipple.  Encouraged mom to allow STS especially prior to pumping and work on latching if she wants.  Discussed options of o/p appt closer to gestational age if baby is not doing better by discharge. DEBP set up with cleaning and storage guidelines discussed. Mom pumping for several minutes not colostrum noted explained to mom this can be normal.  [facility] LC resources given and discussed.  Encouraged to feed with early cues on demand. Mom to wake baby for feedings every 3 hours and will call RN if baby is not doing well with feedings.  Early newborn behavior discussed.  Hand expression demonstrated with colostrum visible.  Mom to call for assist as needed.      Patient Name: Courtney Murray [id number] Date: 07/20/2015 Reason for consult: Initial assessment   Maternal Data Has patient been taught Hand Expression?: Yes Does the patient have breastfeeding experience prior to this delivery?: No  Feeding Feeding Type: Breast Fed  LATCH Score/Interventions Latch: Too sleepy or reluctant, no latch achieved, no  sucking elicited. Intervention(s): Skin to skin;Teach feeding cues;Waking techniques Intervention(s): Breast massage;Breast compression  Audible Swallowing: None  Type of Nipple: Flat Intervention(s): Hand pump;Double electric pump  Comfort (Breast/Nipple): Soft / non-tender     Hold (Positioning): Assistance needed to correctly position infant at breast and maintain latch. Intervention(s): Breastfeeding basics reviewed;Support Pillows;Position options;Skin to skin  LATCH Score: 4  Lactation Tools Discussed/Used Tools: Nipple Shields Pump Review: Setup, frequency, and cleaning;Milk Storage Initiated by:: JS Date initiated:: 07/20/15   Consult Status Consult Status: Follow-up Date: 07/21/15 Follow-up type: In-patient    Vonzell Lindblad, Arvella Merles 07/20/2015, 5:58 PM

## 2015-07-21 MED ORDER — IBUPROFEN 600 MG PO TABS: 600.0000 mg | ORAL_TABLET | Freq: Four times a day (QID) | ORAL | Status: DC

## 2015-07-21 NOTE — Discharge Summary (Signed)
Obstetric Discharge Summary Reason for Admission: onset of labor Prenatal Procedures: ultrasound Intrapartum Procedures: spontaneous vaginal delivery Postpartum Procedures: none Complications-Operative and Postpartum: none HEMOGLOBIN  Date Value Ref Range Status  07/19/2015 11.5* 12.0 - 15.0 g/dL Final   HCT  Date Value Ref Range Status  07/19/2015 35.9* 36.0 - 46.0 % Final   HEMATOCRIT  Date Value Ref Range Status  05/13/2015 35.8 34.0 - 46.6 % Final    Physical Exam:  General: alert, cooperative, appears stated age and no distress Lochia: appropriate Uterine Fundus: firm Incision: n/a DVT Evaluation: No evidence of DVT seen on physical exam. Negative Homan's sign. No cords or calf tenderness.  Discharge Diagnoses: Term Pregnancy-delivered  Discharge Information: Date: 07/21/2015 Activity: pelvic rest Diet: routine Medications: PNV and Ibuprofen Condition: stable and improved Instructions: refer to practice specific booklet Discharge to: home   Newborn Data: Live born female  Birth Weight: 6 lb 4 oz (2835 g) APGAR: 8, 9  Home with mother.  LAWSON, MARIE DARLENE 07/21/2015, 7:13 AM

## 2015-07-21 NOTE — Lactation Note (Signed)
This note was copied from the chart of Courtney Shawneen Lakeman. Lactation Consultation Note; Mom reports she has bottle fed baby through the night. Has not pumped because he was up so much. He is asleep in dad's arms at present. Reports she is still having trouble getting him latched- reports she tried the nipple shield but he did no better with it. Reviewed the importance of frequent pumping if he is not nursing to promote a good milk supply. She has a Lansinol pump at home. Offered OP appointment but they live out of town. States she has [address] WIC and will try to get help there. No questions at present. To call prn   Patient Name: Courtney Murray ZOXWR'U Date: 07/21/2015 Reason for consult: Follow-up assessment;Late preterm infant   Maternal Data Formula Feeding for Exclusion: No Has patient been taught Hand Expression?: Yes Does the patient have breastfeeding experience prior to this delivery?: No  Feeding Feeding Type: Bottle Fed - Formula Nipple Type: Slow - flow  LATCH Score/Interventions                      Lactation Tools Discussed/Used WIC Program: Yes   Consult Status Consult Status: Complete    Pamelia Hoit 07/21/2015, 9:15 AM

## 2015-07-23 ENCOUNTER — Encounter: Payer: 59 | Admitting: Obstetrics and Gynecology

## 2015-07-23 LAB — TYPE AND SCREEN
ABO/RH(D): A POS
ANTIBODY SCREEN: POSITIVE
DAT, IGG: NEGATIVE
UNIT DIVISION: 0
UNIT DIVISION: 0

## 2015-09-02 ENCOUNTER — Encounter: Payer: Self-pay | Admitting: Advanced Practice Midwife

## 2015-09-02 ENCOUNTER — Ambulatory Visit (INDEPENDENT_AMBULATORY_CARE_PROVIDER_SITE_OTHER): Payer: Medicaid Other | Admitting: Advanced Practice Midwife

## 2015-09-02 NOTE — Patient Instructions (Signed)
Check your fasting (before you eat or drink) blood sugar every day for a week.  If you get readings above 100, let me know.

## 2015-09-02 NOTE — Progress Notes (Signed)
  Courtney Murray is a 24 y.o. who presents for a postpartum visit. She is 6 weeks postpartum following a spontaneous vaginal delivery. I have fully reviewed the prenatal and intrapartum course. The delivery was at 37+ gestational weeks.  She had SROM, was induced, and had vacuum assisted delivery d/t terminal bradycardia. Anesthesia: epidural. Postpartum course has been uneventful. Baby's course has been uneventful. Baby is feeding by bottle. Bleeding: no bleeding. Bowel function is normal. Bladder function is normal. Patient is sexually active. Contraception method is abstinence. Postpartum depression screening: negative.   Current outpatient prescriptions:  .  acyclovir (ZOVIRAX) 400 MG tablet, Take 1 tablet (400 mg total) by mouth 3 (three) times daily., Disp: 90 tablet, Rfl: 2  Review of Systems   Constitutional: Negative for fever and chills Eyes: Negative for visual disturbances Respiratory: Negative for shortness of breath, dyspnea Cardiovascular: Negative for chest pain or palpitations  Gastrointestinal: Negative for vomiting, diarrhea and constipation Genitourinary: Negative for dysuria and urgency Musculoskeletal: Negative for back pain, joint pain, myalgias  Neurological: Negative for dizziness and headaches   Objective:     Filed Vitals:   09/02/15 1039  BP: 120/86   General:  alert, cooperative and no distress   Breasts:  negative  Lungs: clear to auscultation bilaterally  Heart:  regular rate and rhythm  Abdomen: Soft, nontender   Vulva:  normal  Vagina: normal vagina  Cervix:  closed  Corpus: Well involuted     Rectal Exam: no hemorrhoids        Assessment:    normal postpartum exam.  Plan:    1. Contraception: none 2. Take FBS qd X 1 week--notify us if >100   3. Follow up  3 weeks For Nexplanon no sex until then

## 2015-09-23 ENCOUNTER — Encounter: Payer: Self-pay | Admitting: Women's Health

## 2015-09-23 ENCOUNTER — Ambulatory Visit (INDEPENDENT_AMBULATORY_CARE_PROVIDER_SITE_OTHER): Payer: Medicaid Other | Admitting: Women's Health

## 2015-09-23 VITALS — BP 120/76 | HR 76 | Wt 155.0 lb

## 2015-09-23 DIAGNOSIS — Z3202 Encounter for pregnancy test, result negative: Secondary | ICD-10-CM | POA: Diagnosis not present

## 2015-09-23 DIAGNOSIS — Z01419 Encounter for gynecological examination (general) (routine) without abnormal findings: Secondary | ICD-10-CM

## 2015-09-23 DIAGNOSIS — Z30017 Encounter for initial prescription of implantable subdermal contraceptive: Secondary | ICD-10-CM

## 2015-09-23 HISTORY — DX: Encounter for gynecological examination (general) (routine) without abnormal findings: Z01.419

## 2015-09-23 LAB — POCT URINE PREGNANCY: Preg Test, Ur: NEGATIVE

## 2015-09-23 NOTE — Progress Notes (Signed)
Patient ID: Courtney Murray, female   DOB: 1991-07-29, [age] y.o.   MRN: [id number] Courtney Murray is a 24 y.o. year old African American female here for Nexplanon insertion.  Patient's last menstrual period was 09/17/2015., last sexual intercourse was prior to birth of baby, and her pregnancy test today was negative.  Risks/benefits/side effects of Nexplanon have been discussed and her questions have been answered.  Specifically, a failure rate of 11/998 has been reported, with an increased failure rate if pt takes St. John's Wort and/or antiseizure medicaitons.  Courtney Murray is aware of the common side effect of irregular bleeding, which the incidence of decreases over time.  BP 120/76 mmHg  Pulse 76  Wt 155 lb (70.308 kg)  LMP 09/17/2015  Breastfeeding? No  Results for orders placed or performed in visit on 09/23/15 (from the past 24 hour(s))  POCT urine pregnancy   Collection Time: 09/23/15 11:25 AM  Result Value Ref Range   Preg Test, Ur Negative Negative     She is right-handed, so her left arm, approximately 4 inches proximal from the elbow, was cleansed with alcohol and anesthetized with 2cc of 2% Lidocaine.  The area was cleansed again with betadine and the Nexplanon was inserted per manufacturer's recommendations without difficulty.  A steri-strip and pressure bandage were applied.  Pt was instructed to keep the area clean and dry, remove pressure bandage in 24 hours, and keep insertion site covered with the steri-strip for 3-5 days.  Back up contraception was recommended for 2 weeks.  She was given a card indicating date Nexplanon was inserted and date it needs to be removed. Follow-up PRN problems.  Marge DuncansBooker, Berdene Askari Randall CNM, [facility] 09/23/2015 11:50 AM

## 2015-09-23 NOTE — Patient Instructions (Signed)

## 2015-12-25 ENCOUNTER — Encounter: Payer: Medicaid Other | Admitting: Advanced Practice Midwife

## 2016-03-09 NOTE — Telephone Encounter (Signed)
error 

## 2016-04-21 ENCOUNTER — Encounter: Payer: Self-pay | Admitting: Women's Health

## 2016-04-21 ENCOUNTER — Ambulatory Visit (INDEPENDENT_AMBULATORY_CARE_PROVIDER_SITE_OTHER): Payer: BLUE CROSS/BLUE SHIELD | Admitting: Women's Health

## 2016-04-21 VITALS — BP 118/64 | HR 74 | Ht <= 58 in | Wt 150.0 lb

## 2016-04-21 DIAGNOSIS — B372 Candidiasis of skin and nail: Secondary | ICD-10-CM

## 2016-04-21 DIAGNOSIS — L292 Pruritus vulvae: Secondary | ICD-10-CM | POA: Diagnosis not present

## 2016-04-21 DIAGNOSIS — B373 Candidiasis of vulva and vagina: Secondary | ICD-10-CM

## 2016-04-21 DIAGNOSIS — B3731 Acute candidiasis of vulva and vagina: Secondary | ICD-10-CM

## 2016-04-21 DIAGNOSIS — Z8632 Personal history of gestational diabetes: Secondary | ICD-10-CM | POA: Insufficient documentation

## 2016-04-21 LAB — POCT WET PREP (WET MOUNT): CLUE CELLS WET PREP WHIFF POC: NEGATIVE

## 2016-04-21 MED ORDER — FLUCONAZOLE 150 MG PO TABS: 150.0000 mg | ORAL_TABLET | Freq: Once | ORAL | Status: DC

## 2016-04-21 NOTE — Progress Notes (Signed)
Patient ID: Courtney HughDiondra M Bezdek, female   DOB: Nov 04, 1991, 25 y.o.   MRN: [id number]   [facility]  Patient name: Courtney Murray MRN [id number]  Date of birth: Nov 04, 1991  CC & HPI:  Courtney Murray is a 25 y.o. [id number] African American female presenting today for report of vulvar itching w/ rash on inner thighs x 1wk. No abnormal d/c, odor. 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 Has never had HSV outbreak before. Doesn't like nexplanon b/c she's not having periods, otherwise it is fine. We discussed this before I placed it and she was aware that she may not have periods. Discussed mechanism of action, great effectiveness, if no major complaints, I recommend leaving in. Pt ok w/ this.   No LMP recorded. Patient has had an implant. The current method of family planning is nexplanon. Last pap jun 2015 normal  Pertinent History Reviewed:  Medical & Surgical Hx:   Past medical, surgical, family, and social history reviewed in electronic medical record Medications: Reviewed & Updated - see associated section Allergies: Reviewed in electronic medical record  Objective Findings:  Vitals: BP 118/64 mmHg  Pulse 74  Ht 4\' 9"  (1.448 m)  Wt 150 lb (68.04 kg)  BMI 32.45 kg/m2 Body mass index is 32.45 kg/(m^2).  Physical Examination: General appearance - alert, well appearing, and in no distress Pelvic - skin candida bilateral groin- painted w/ gentian violet, cx visually clear, mod amt thick white nonodorousd/c, wet prep few yeast  Results for orders placed or performed in visit on 04/21/16 (from the past 24 hour(s))  POCT Wet Prep Mellody Drown(Wet [place])   Collection Time: 04/21/16 11:42 AM  Result Value Ref Range   Source Wet Prep POC vaginal    WBC, Wet Prep HPF POC few    Bacteria Wet Prep HPF POC None None, Few, Too numerous to count   BACTERIA WET PREP MORPHOLOGY POC     Clue Cells Wet Prep HPF POC None None,  Too numerous to count   Clue Cells Wet Prep Whiff POC Negative Whiff    Yeast Wet Prep HPF POC Few    KOH Wet Prep POC     Trichomonas Wet Prep HPF POC none      Assessment & Plan:  A:   Vulvovaginal and skin candida  P:  Rx diflucan x 1, repeat in 3d if needed  No sex x 1wk  Serum HSV2 titer was low during pregnancy, may have possibly been false+, ok to stop acyclovir, if ever gets any outbreaks can take  Return in about 4 weeks (around 05/19/2016) for physical.  Marge DuncansBooker, Britiney Blahnik Randall CNM, WHNP-BC 04/21/2016 11:43 AM

## 2016-05-19 ENCOUNTER — Ambulatory Visit (INDEPENDENT_AMBULATORY_CARE_PROVIDER_SITE_OTHER): Payer: BLUE CROSS/BLUE SHIELD | Admitting: Adult Health

## 2016-05-19 ENCOUNTER — Encounter: Payer: Self-pay | Admitting: Adult Health

## 2016-05-19 ENCOUNTER — Other Ambulatory Visit (HOSPITAL_COMMUNITY)
Admission: RE | Admit: 2016-05-19 | Discharge: 2016-05-19 | Disposition: A | Discharge status: Home / Self Care | Payer: BLUE CROSS/BLUE SHIELD | Source: Ambulatory Visit | Attending: Adult Health | Admitting: Adult Health

## 2016-05-19 VITALS — BP 128/80 | HR 60 | Ht <= 58 in | Wt 152.0 lb

## 2016-05-19 DIAGNOSIS — Z113 Encounter for screening for infections with a predominantly sexual mode of transmission: Secondary | ICD-10-CM | POA: Insufficient documentation

## 2016-05-19 DIAGNOSIS — Z975 Presence of (intrauterine) contraceptive device: Secondary | ICD-10-CM | POA: Insufficient documentation

## 2016-05-19 DIAGNOSIS — Z3009 Encounter for other general counseling and advice on contraception: Secondary | ICD-10-CM

## 2016-05-19 DIAGNOSIS — Z01419 Encounter for gynecological examination (general) (routine) without abnormal findings: Secondary | ICD-10-CM

## 2016-05-19 HISTORY — DX: Presence of (intrauterine) contraceptive device: Z97.5

## 2016-05-19 NOTE — Progress Notes (Addendum)
Patient ID: Courtney Murray, female   DOB: 10-15-1991, 25 y.o.   MRN: [id number] History of Present Illness: [name] is a 25 year old black female in for a well woman gyn exam and pap and she has nexplanon and does not have a period.   Current Medications, Allergies, Past Medical History, Past Surgical History, Family History and Social History were reviewed in [name].     Review of Systems: Patient denies any headaches, hearing loss, fatigue, blurred vision, shortness of breath, chest pain, abdominal pain, problems with bowel movements, urination, or intercourse. No joint pain or mood swings.    Physical Exam:BP 128/80 mmHg  Pulse 60  Ht 4\' 10"  (1.473 m)  Wt 152 lb (68.947 kg)  BMI 31.78 kg/m2  Breastfeeding? No General:  Well developed, well nourished, no acute distress Skin:  Warm and dry Neck:  Midline trachea, normal thyroid, good ROM, no lymphadenopathy Lungs; Clear to auscultation bilaterally Breast:  No dominant palpable mass, retraction, or nipple discharge Cardiovascular: Regular rate and rhythm Abdomen:  Soft, non tender, no hepatosplenomegaly Pelvic:  External genitalia is normal in appearance, no lesions.  The vagina is normal in appearance. Urethra has no lesions or masses. The cervix is bulbous. Pap with GC/CHL performed. Uterus is felt to be normal size, shape, and contour.  No adnexal masses or tenderness noted.Bladder is non tender, no masses felt. Extremities/musculoskeletal:  No swelling or varicosities noted, no clubbing or cyanosis,nexplanon easily palpated left arm Psych:  No mood changes, alert and cooperative,seems happy   Impression: Well woman gyn exam and pap Family planning Nexplanon in place    Plan: Check CBC,CMP,TSH and lipids,A1c and vitamin D, HIV,RPR and HSV 2  Physical in 1 year, pap in 3 if normal       [facility] would not draw her labs today, has she owes them money.

## 2016-05-19 NOTE — Patient Instructions (Signed)
Physical in 1 year,pap in 3 if normal 

## 2016-05-20 LAB — CYTOLOGY - PAP

## 2017-01-14 ENCOUNTER — Encounter: Payer: Self-pay | Admitting: Women's Health

## 2017-01-14 ENCOUNTER — Ambulatory Visit (INDEPENDENT_AMBULATORY_CARE_PROVIDER_SITE_OTHER): Payer: BLUE CROSS/BLUE SHIELD | Admitting: Women's Health

## 2017-01-14 VITALS — BP 126/66 | HR 64 | Ht 59.0 in | Wt 158.0 lb

## 2017-01-14 DIAGNOSIS — L290 Pruritus ani: Secondary | ICD-10-CM | POA: Diagnosis not present

## 2017-01-14 DIAGNOSIS — B373 Candidiasis of vulva and vagina: Secondary | ICD-10-CM

## 2017-01-14 DIAGNOSIS — L292 Pruritus vulvae: Secondary | ICD-10-CM | POA: Diagnosis not present

## 2017-01-14 DIAGNOSIS — B9689 Other specified bacterial agents as the cause of diseases classified elsewhere: Secondary | ICD-10-CM

## 2017-01-14 DIAGNOSIS — N760 Acute vaginitis: Secondary | ICD-10-CM

## 2017-01-14 DIAGNOSIS — B3731 Acute candidiasis of vulva and vagina: Secondary | ICD-10-CM

## 2017-01-14 LAB — POCT WET PREP (WET MOUNT)
Clue Cells Wet Prep Whiff POC: NEGATIVE
TRICHOMONAS WET PREP HPF POC: ABSENT

## 2017-01-14 MED ORDER — FLUCONAZOLE 150 MG PO TABS: 150.0000 mg | ORAL_TABLET | Freq: Once | ORAL | 0 refills | Status: AC

## 2017-01-14 MED ORDER — METRONIDAZOLE 0.75 % VA GEL: 1.0000 | Freq: Every day | VAGINAL | 0 refills | Status: DC

## 2017-01-14 NOTE — Progress Notes (Signed)
   [facility]  Patient name: Courtney Murray MRN [id number]  Date of birth: Mar 30, 1991  CC & HPI:  Courtney Murray is a 26 y.o. 712P1011 African American female presenting today for report of vulvar itching x 2 days, no d/c or odor she's noticed. Also has anal itching she's had for awhile which is relieved by son's diaper rash cream and coconut oil. Denies constipation.  No LMP recorded. Patient has had an implant. The current method of family planning is nexplanon. Last pap July 2017, neg  Pertinent History Reviewed:  Medical & Surgical Hx:   Past medical, surgical, family, and social history reviewed in electronic medical record Medications: Reviewed & Updated - see associated section Allergies: Reviewed in electronic medical record  Objective Findings:  Vitals: BP 126/66 (BP Location: Right Arm, Patient Position: Sitting, Cuff Size: Normal)   Pulse 64   Ht 4\' 11"  (1.499 m)   Wt 158 lb (71.7 kg)   BMI 31.91 kg/m  Body mass index is 31.91 kg/m.  Physical Examination: General appearance - alert, well appearing, and in no distress Pelvic - no erythema externally, cx clear, mod amt thick clumpy white d/c adherent to walls Anal area appears slightly white, no erythema, no hemorrhoids  Results for orders placed or performed in visit on 01/14/17 (from the past 24 hour(s))  POCT Wet Prep Mellody Drown([place]et [place])   Collection Time: 01/14/17 10:35 AM  Result Value Ref Range   Source Wet Prep POC vaginal    WBC, Wet Prep HPF POC few    Bacteria Wet Prep HPF POC None (A) Few   BACTERIA WET PREP MORPHOLOGY POC     Clue Cells Wet Prep HPF POC Few (A) None   Clue Cells Wet Prep Whiff POC Negative Whiff    Yeast Wet Prep HPF POC Few    KOH Wet Prep POC     Trichomonas Wet Prep HPF POC Absent Absent     Assessment & Plan:  A:   Vulvovaginal candida  BV  Anal pruritus  P:  Rx metrogel qhs x 5nights, no sex or etoh while taking, pt preferred gel over flagyl  Rx diflucan 150mg  1  now, 1 in 3d if needed for yeast  Continue diaper rash cream/coconut oil for anal pruritus, can use hydrocortisone cream if needed  Return for July for physical.  Marge DuncansBooker, Cheralyn Oliver Randall CNM, [facility] 01/14/2017 10:35 AM

## 2017-01-14 NOTE — Patient Instructions (Signed)
Bacterial Vaginosis Bacterial vaginosis is a vaginal infection that occurs when the normal balance of bacteria in the vagina is disrupted. It results from an overgrowth of certain bacteria. This is the most common vaginal infection among women ages 15-44. Because bacterial vaginosis increases your risk for STIs (sexually transmitted infections), getting treated can help reduce your risk for chlamydia, gonorrhea, herpes, and HIV (human immunodeficiency virus). Treatment is also important for preventing complications in pregnant women, because this condition can cause an early (premature) delivery. What are the causes? This condition is caused by an increase in harmful bacteria that are normally present in small amounts in the vagina. However, the reason that the condition develops is not fully understood. What increases the risk? The following factors may make you more likely to develop this condition:  Having a new sexual partner or multiple sexual partners.  Having unprotected sex.  Douching.  Having an intrauterine device (IUD).  Smoking.  Drug and alcohol abuse.  Taking certain antibiotic medicines.  Being pregnant.  You cannot get bacterial vaginosis from toilet seats, bedding, swimming pools, or contact with objects around you. What are the signs or symptoms? Symptoms of this condition include:  Grey or white vaginal discharge. The discharge can also be watery or foamy.  A fish-like odor with discharge, especially after sexual intercourse or during menstruation.  Itching in and around the vagina.  Burning or pain with urination.  Some women with bacterial vaginosis have no signs or symptoms. How is this diagnosed? This condition is diagnosed based on:  Your medical history.  A physical exam of the vagina.  Testing a sample of vaginal fluid under a microscope to look for a large amount of bad bacteria or abnormal cells. Your health care provider may use a cotton swab  or a small wooden spatula to collect the sample.  How is this treated? This condition is treated with antibiotics. These may be given as a pill, a vaginal cream, or a medicine that is put into the vagina (suppository). If the condition comes back after treatment, a second round of antibiotics may be needed. Follow these instructions at home: Medicines  Take over-the-counter and prescription medicines only as told by your health care provider.  Take or use your antibiotic as told by your health care provider. Do not stop taking or using the antibiotic even if you start to feel better. General instructions  If you have a female sexual partner, tell her that you have a vaginal infection. She should see her health care provider and be treated if she has symptoms. If you have a female sexual partner, he does not need treatment.  During treatment: ? Avoid sexual activity until you finish treatment. ? Do not douche. ? Avoid alcohol as directed by your health care provider. ? Avoid breastfeeding as directed by your health care provider.  Drink enough water and fluids to keep your urine clear or pale yellow.  Keep the area around your vagina and rectum clean. ? Wash the area daily with warm water. ? Wipe yourself from front to back after using the toilet.  Keep all follow-up visits as told by your health care provider. This is important. How is this prevented?  Do not douche.  Wash the outside of your vagina with warm water only.  Use protection when having sex. This includes latex condoms and dental dams.  Limit how many sexual partners you have. To help prevent bacterial vaginosis, it is best to have sex with just   one partner (monogamous).  Make sure you and your sexual partner are tested for STIs.  Wear cotton or cotton-lined underwear.  Avoid wearing tight pants and pantyhose, especially during summer.  Limit the amount of alcohol that you drink.  Do not use any products that  contain nicotine or tobacco, such as cigarettes and e-cigarettes. If you need help quitting, ask your health care provider.  Do not use illegal drugs. Where to find more information:  Centers for Disease Control and Prevention: www.cdc.gov/std  American Sexual Health Association (ASHA): www.ashastd.org  U.S. Department of Health and Human Services, Office on Women's Health: www.womenshealth.gov/ or https://www.womenshealth.gov/a-z-topics/bacterial-vaginosis Contact a health care provider if:  Your symptoms do not improve, even after treatment.  You have more discharge or pain when urinating.  You have a fever.  You have pain in your abdomen.  You have pain during sex.  You have vaginal bleeding between periods. Summary  Bacterial vaginosis is a vaginal infection that occurs when the normal balance of bacteria in the vagina is disrupted.  Because bacterial vaginosis increases your risk for STIs (sexually transmitted infections), getting treated can help reduce your risk for chlamydia, gonorrhea, herpes, and HIV (human immunodeficiency virus). Treatment is also important for preventing complications in pregnant women, because the condition can cause an early (premature) delivery.  This condition is treated with antibiotic medicines. These may be given as a pill, a vaginal cream, or a medicine that is put into the vagina (suppository). This information is not intended to replace advice given to you by your health care provider. Make sure you discuss any questions you have with your health care provider. Document Released: 11/01/2005 Document Revised: 07/17/2016 Document Reviewed: 07/17/2016 Elsevier Interactive Patient Education  2017 [employer]  

## 2017-02-22 ENCOUNTER — Encounter (HOSPITAL_COMMUNITY): Payer: Self-pay | Admitting: Emergency Medicine

## 2017-02-22 DIAGNOSIS — Z87891 Personal history of nicotine dependence: Secondary | ICD-10-CM | POA: Diagnosis not present

## 2017-02-22 DIAGNOSIS — H578 Other specified disorders of eye and adnexa: Secondary | ICD-10-CM | POA: Diagnosis present

## 2017-02-22 DIAGNOSIS — H109 Unspecified conjunctivitis: Secondary | ICD-10-CM | POA: Diagnosis not present

## 2017-02-22 NOTE — ED Triage Notes (Signed)
Pt c/o right eye swelling that started this evening. Pt states she hit the right side of her head on couch earlier today and denies any loc.

## 2017-02-23 ENCOUNTER — Emergency Department (HOSPITAL_COMMUNITY)
Admission: EM | Admit: 2017-02-23 | Discharge: 2017-02-23 | Disposition: A | Discharge status: Home / Self Care | Payer: BLUE CROSS/BLUE SHIELD | Attending: Emergency Medicine | Admitting: Emergency Medicine

## 2017-02-23 DIAGNOSIS — H109 Unspecified conjunctivitis: Secondary | ICD-10-CM

## 2017-02-23 MED ORDER — TOBRAMYCIN 0.3 % OP SOLN
2.0000 [drp] | Freq: Once | OPHTHALMIC | Status: AC
  Administered 2017-02-23: 2 [drp] via OPHTHALMIC
  Filled 2017-02-23: qty 5

## 2017-02-23 MED ORDER — IBUPROFEN 800 MG PO TABS
800.0000 mg | ORAL_TABLET | Freq: Once | ORAL | Status: AC
  Administered 2017-02-23: 800 mg via ORAL
  Filled 2017-02-23: qty 1

## 2017-02-23 NOTE — ED Notes (Signed)
Pt states understanding of care given and follow up instructions.  Pt ambulated from ED, A/O with steady gait

## 2017-02-23 NOTE — Discharge Instructions (Signed)
Your examination is consistent with conjunctivitis or pinkeye. Please use cool compresses to your eye several times during the day. Use 2 drops of tobramycin in the right eye every 4 hours for the next 4 or 5 days. Please see your primary physician or return to the emergency department if any changes or problems.

## 2017-02-23 NOTE — ED Provider Notes (Signed)
[facility] DEPT Provider Note   CSN: [id number] Arrival date & time: 02/22/17  2135     History   Chief Complaint Chief Complaint  Patient presents with  . Facial Swelling    HPI Courtney Murray is a 26 y.o. female.  Patient is a 26 year old female who presents to the emergency department with complaint of right eye swelling.  Patient reports swelling around the right eye that started this afternoon. The patient states that she was adjusting her young sons coat, when he moved, she lost her balance, and hit her right for head and right face on a couch. There was no loss of consciousness. There's been no difficulty with vision or opening the mouth. She states that she has some soreness at times some tingling in that area. The patient states that later she began to notice right eye swelling. She denies sneezing. She denies any unusual headache. There's been no other trauma to the eye or to the head. She's not been exposed to any unusual chemicals or smoke. 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 She has not taken any medications up to this point.   The history is provided by the patient.    Past Medical History:  Diagnosis Date  . Chlamydia   . Contraceptive management 05/09/2014  . Gestational diabetes   . Gestational diabetes mellitus, antepartum   . Gonorrhea in female   . Nexplanon in place 05/19/2016  . Pregnant 11/25/2014  . Vaginal itching 08/27/2014  . Yeast infection of the vagina 08/27/2014    Patient Active Problem List   Diagnosis Date Noted  . Nexplanon in place 05/19/2016  . History of gestational diabetes 04/21/2016  . Nexplanon insertion 09/23/2015  . 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 NO PAST SURGERIES      OB History    Gravida Para Term Preterm AB Living   SAB TAB Ectopic Multiple Live Births     1   0 1       Home Medications    Prior to Admission medications     Medication Sig Start Date End Date Taking? Authorizing Provider  etonogestrel (NEXPLANON) 68 MG IMPL implant 1 each by Subdermal route once.    Historical Provider, MD  metroNIDAZOLE (METROGEL VAGINAL) 0.75 % vaginal gel Place 1 Applicatorful vaginally at bedtime. X 5 nights, no alcohol or sex while using 01/14/17   Cheral Marker, CNM    Family History Family History  Problem Relation Age of Onset  . Stroke Paternal Grandmother   . Heart disease Paternal Grandmother   . Diabetes Maternal Grandmother     borderline  . Thyroid disease Maternal Grandmother   . Stroke Maternal Grandmother   . Other Maternal Grandmother     blood clots  . Sleep apnea Father   . Diabetes Mother     borderline  . Sleep apnea Brother     Social History Social History  Substance Use Topics  . Smoking status: Former Smoker    Types: Cigars  . Smokeless tobacco: Never Used  . Alcohol use No     Allergies   Patient has no known allergies.   Review of Systems Review of Systems  Eyes: Positive for photophobia and redness.  All other systems reviewed and are negative.    Physical Exam Updated Vital Signs BP 124/70   Pulse 65  Temp 98 F (36.7 C)   Resp 18   Ht  (1.499 m)   Wt 72.6 kg   SpO2 100%   BMI 32.32 kg/m   Physical Exam  Constitutional: She is oriented to person, place, and time. She appears well-developed and well-nourished.  Non-toxic appearance.  HENT:  Head: Normocephalic.  Right Ear: Tympanic membrane and external ear normal.  Left Ear: Tympanic membrane and external ear normal.  Eyes: EOM and lids are normal. Pupils are equal, round, and reactive to light. Lids are everted and swept, no foreign bodies found. Right eye exhibits no discharge and no hordeolum. No foreign body present in the right eye. Left eye exhibits no discharge and no hordeolum. No foreign body present in the left eye. Right conjunctiva is injected. Right conjunctiva has no hemorrhage. Left  conjunctiva has no hemorrhage.  Pt has tearing, but no discharge noted.  Neck: Normal range of motion. Neck supple. Carotid bruit is not present.  Cardiovascular: Normal rate, regular rhythm, normal heart sounds, intact distal pulses and normal pulses.   Pulmonary/Chest: Breath sounds normal. No respiratory distress.  Abdominal: Soft. Bowel sounds are normal. There is no tenderness. There is no guarding.  Musculoskeletal: Normal range of motion.  Lymphadenopathy:       Head (right side): No submandibular adenopathy present.       Head (left side): No submandibular adenopathy present.    She has no cervical adenopathy.  Neurological: She is alert and oriented to person, place, and time. She has normal strength. No cranial nerve deficit or sensory deficit.  Skin: Skin is warm and dry.  Psychiatric: She has a normal mood and affect. Her speech is normal.  Nursing note and vitals reviewed.    ED Treatments / Results  Labs (all labs ordered are listed, but only abnormal results are displayed) Labs Reviewed - No data to display  EKG  EKG Interpretation None       Radiology No results found.  Procedures Procedures (including critical care time)  Medications Ordered in ED Medications - No data to display   Initial Impression / Assessment and Plan / ED Course  I have reviewed the triage vital signs and the nursing notes.  Pertinent labs & imaging results that were available during my care of the patient were reviewed by me and considered in my medical decision making (see chart for details).     **I have reviewed nursing notes, vital signs, and all appropriate lab and imaging results for this patient.*  Final Clinical Impressions(s) / ED Diagnoses MDM Vital signs within normal limits. The examination is consistent with conjunctivitis. I do not feel the mild swelling under the right eye has anything to do with the bumping of her head on the couch. The patient will use cool  compresses. Patient has been given tobramycin to use. The patient is to see her primary physician or return to the emergency department if any changes or problems. We discussed the contagious nature of this issue.    Final diagnoses:  None    New Prescriptions New Prescriptions   No medications on file     Ivery Quale, PA-C 02/23/17 1610    Devoria Albe, MD 02/23/17 [phone]

## 2017-10-11 ENCOUNTER — Ambulatory Visit: Payer: BLUE CROSS/BLUE SHIELD | Admitting: Adult Health

## 2017-10-11 ENCOUNTER — Encounter: Payer: Self-pay | Admitting: Adult Health

## 2017-10-11 VITALS — BP 108/72 | HR 66 | Ht 58.25 in | Wt 165.0 lb

## 2017-10-11 DIAGNOSIS — Z01419 Encounter for gynecological examination (general) (routine) without abnormal findings: Secondary | ICD-10-CM

## 2017-10-11 DIAGNOSIS — Z975 Presence of (intrauterine) contraceptive device: Secondary | ICD-10-CM

## 2017-10-11 DIAGNOSIS — Z8632 Personal history of gestational diabetes: Secondary | ICD-10-CM | POA: Diagnosis not present

## 2017-10-11 DIAGNOSIS — L290 Pruritus ani: Secondary | ICD-10-CM | POA: Insufficient documentation

## 2017-10-11 DIAGNOSIS — Z01411 Encounter for gynecological examination (general) (routine) with abnormal findings: Secondary | ICD-10-CM | POA: Diagnosis not present

## 2017-10-11 MED ORDER — HYDROCORTISONE ACE-PRAMOXINE 2.5-1 % RE CREA: 1.0000 "application " | TOPICAL_CREAM | Freq: Three times a day (TID) | RECTAL | 1 refills | Status: DC

## 2017-10-11 NOTE — Progress Notes (Signed)
Patient ID: Courtney Murray, female   DOB: 04-17-1991, 26 y.o.   MRN: [id number] History of Present Illness:  Courtney Murray is a 26 year old black female in for a well woman gyn exam, she had a normal pap 05/19/16. PCP is DTE Energy CompanyScott Murray.   Current Medications, Allergies, Past Medical History, Past Surgical History, Family History and Social History were reviewed in [name].     Review of Systems: Patient denies any headaches, hearing loss, fatigue, blurred vision, shortness of breath, chest pain, abdominal pain, problems with bowel movements, urination, or intercourse. No joint pain or mood swings. +rectal itching    Physical Exam:BP 108/72 (BP Location: Left Arm, Patient Position: Sitting, Cuff Size: Normal)   Pulse 66   Ht 4' 10.25" (1.48 m)   Wt 165 lb (74.8 kg)   BMI 34.19 kg/m  General:  Well developed, well nourished, no acute distress Skin:  Warm and dry Neck:  Midline trachea, normal thyroid, good ROM, no lymphadenopathy Lungs; Clear to auscultation bilaterally Breast:  No dominant palpable mass, retraction, or nipple discharge Cardiovascular: Regular rate and rhythm Abdomen:  Soft, non tender, no hepatosplenomegaly Pelvic:  External genitalia is normal in appearance, no lesions.  The vagina is normal in appearance. Urethra has no lesions or masses. The cervix is bulbous.  Uterus is felt to be normal size, shape, and contour.  No adnexal masses or tenderness noted.Bladder is non tender, no masses felt. Rectal: Good sphincter tone, no polyps, or hemorrhoids felt.   Extremities/musculoskeletal:  No swelling or varicosities noted, no clubbing or cyanosis Psych:  No mood changes, alert and cooperative,seems happy PHQ 2 score 0.  Impression: 1. Encounter for well woman exam with routine gynecological exam   2. Nexplanon in place   3. Rectal itching   4. History of gestational diabetes       Plan:  Check CBC,CMP,TSH and lipids,A1c Meds ordered  this encounter  Medications  . hydrocortisone-pramoxine (ANALPRAM-HC) 2.5-1 % rectal cream    Sig: Place 1 application rectally 3 (three) times daily.    Dispense:  30 g    Refill:  1    Order Specific Question:   Supervising Provider    Answer:   Lazaro ArmsEURE, LUTHER H [[id number]]  Physical in 1 year Pap in 2020 Review handout on birth control options, nexplanon to be removed next year, may get another one

## 2017-10-12 ENCOUNTER — Telehealth: Payer: Self-pay | Admitting: *Deleted

## 2017-10-12 LAB — COMPREHENSIVE METABOLIC PANEL
AG RATIO: 1.4 (calc) (ref 1.0–2.5)
ALBUMIN MSPROF: 4.1 g/dL (ref 3.6–5.1)
ALKALINE PHOSPHATASE (APISO): 89 U/L (ref 33–115)
ALT: 19 U/L (ref 6–29)
AST: 17 U/L (ref 10–30)
BUN: 12 mg/dL (ref 7–25)
CHLORIDE: 103 mmol/L (ref 98–110)
CO2: 29 mmol/L (ref 20–32)
CREATININE: 0.82 mg/dL (ref 0.50–1.10)
Calcium: 9.6 mg/dL (ref 8.6–10.2)
GLOBULIN: 2.9 g/dL (ref 1.9–3.7)
Glucose, Bld: 90 mg/dL (ref 65–99)
POTASSIUM: 4.8 mmol/L (ref 3.5–5.3)
Sodium: 137 mmol/L (ref 135–146)
Total Bilirubin: 0.8 mg/dL (ref 0.2–1.2)
Total Protein: 7 g/dL (ref 6.1–8.1)

## 2017-10-12 LAB — HEMOGLOBIN A1C
EAG (MMOL/L): 6.3 (calc)
Hgb A1c MFr Bld: 5.6 % of total Hgb (ref <5.7)
Mean Plasma Glucose: 114 (calc)

## 2017-10-12 LAB — LIPID PANEL
CHOL/HDL RATIO: 2.6 (calc) (ref <5.0)
Cholesterol: 142 mg/dL (ref <200)
HDL: 55 mg/dL (ref >50)
LDL CHOLESTEROL (CALC): 74 mg/dL
Non-HDL Cholesterol (Calc): 87 mg/dL (calc) (ref <130)
Triglycerides: 51 mg/dL (ref <150)

## 2017-10-12 LAB — CBC
HEMATOCRIT: 43 % (ref 35.0–45.0)
HEMOGLOBIN: 13.6 g/dL (ref 11.7–15.5)
MCH: 25 pg — AB (ref 27.0–33.0)
MCHC: 31.6 g/dL — AB (ref 32.0–36.0)
MCV: 79 fL — ABNORMAL LOW (ref 80.0–100.0)
MPV: 10.9 fL (ref 7.5–12.5)
Platelets: 244 10*3/uL (ref 140–400)
RBC: 5.44 10*6/uL — ABNORMAL HIGH (ref 3.80–5.10)
RDW: 13.8 % (ref 11.0–15.0)
WBC: 6.9 10*3/uL (ref 3.8–10.8)

## 2017-10-12 LAB — TSH: TSH: 0.82 m[IU]/L

## 2017-10-12 NOTE — Telephone Encounter (Signed)
Says analpram HC was $104 will try anusol HC OTC, and is aware of labs.

## 2017-10-14 ENCOUNTER — Encounter: Payer: Self-pay | Admitting: Nurse Practitioner

## 2017-10-14 ENCOUNTER — Ambulatory Visit: Payer: BLUE CROSS/BLUE SHIELD | Admitting: Nurse Practitioner

## 2017-10-14 VITALS — BP 114/76 | Temp 98.2°F | Wt 166.4 lb

## 2017-10-14 DIAGNOSIS — J069 Acute upper respiratory infection, unspecified: Secondary | ICD-10-CM

## 2017-10-14 MED ORDER — AZITHROMYCIN 250 MG PO TABS: ORAL_TABLET | ORAL | 0 refills | Status: DC

## 2017-10-14 NOTE — Progress Notes (Signed)
Subjective: Presents for complaints of body aches congestion and sore throat that began on 11/25.  Symptoms began to get worse 2 days ago.  Chills but no fever.  Sore throat.  Slight headache.  Runny nose.  No cough or wheezing.  Bilateral ear pain.  No vomiting diarrhea abdominal pain.  Taking fluids well.  Voiding normal limit.  Objective:   BP 114/76   Temp 98.2 F (36.8 C) (Oral)   Wt 166 lb 6 oz (75.5 kg)   BMI 34.47 kg/m  NAD.  Alert, oriented.  Mildly fatigued in appearance.  TMs clear effusion, no erythema.  Pharynx posterior mildly erythematous with PND noted.  Neck supple with mild soft anterior adenopathy.  Lungs clear.  Heart regular rate and rhythm.  Assessment:  Acute upper respiratory infection    Plan:   Meds ordered this encounter  Medications  . azithromycin (ZITHROMAX Z-PAK) 250 MG tablet    Sig: Take 2 tablets (500 mg) on  Day 1,  followed by 1 tablet (250 mg) once daily on Days 2 through 5.    Dispense:  6 each    Refill:  0    Order Specific Question:   Supervising Provider    Answer:   Riccardo DubinLUKING, WILLIAM S [[id number]]   Explained the illness could be viral but with the length of her symptoms in the weekend coming up, will cover with Zithromax as a precaution.  Reviewed symptomatic care and warning signs.  Call back in 3-4 days if no improvement, go to ED or urgent care over the weekend if worse.

## 2018-01-09 ENCOUNTER — Encounter: Payer: Self-pay | Admitting: Family Medicine

## 2018-01-09 ENCOUNTER — Ambulatory Visit: Payer: BC Managed Care – PPO | Admitting: Family Medicine

## 2018-01-09 VITALS — BP 130/78 | Temp 98.9°F | Ht <= 58 in | Wt 164.0 lb

## 2018-01-09 DIAGNOSIS — J019 Acute sinusitis, unspecified: Secondary | ICD-10-CM

## 2018-01-09 DIAGNOSIS — B9689 Other specified bacterial agents as the cause of diseases classified elsewhere: Secondary | ICD-10-CM | POA: Diagnosis not present

## 2018-01-09 MED ORDER — AMOXICILLIN-POT CLAVULANATE 875-125 MG PO TABS: 1.0000 | ORAL_TABLET | Freq: Two times a day (BID) | ORAL | 0 refills | Status: DC

## 2018-01-09 MED ORDER — PREDNISONE 10 MG PO TABS
ORAL_TABLET | ORAL | 0 refills | Status: DC
Start: 2018-01-09 — End: 2018-04-20

## 2018-01-09 NOTE — Patient Instructions (Signed)

## 2018-01-09 NOTE — Progress Notes (Signed)
   Subjective:    Patient ID: Courtney Murray, female    DOB: 1991-10-02, 27 y.o.   MRN: [id number]  Sinusitis  This is a new problem. Episode onset: one week. Associated symptoms include congestion, coughing and a sore throat. Pertinent negatives include no ear pain or shortness of breath. (Facial pain, right eye red) Treatments tried: urgent care yesteday and prescribed amoxil for ear infection.  Sinus symptoms head congestion drainage sinus pressure pain discomfort worse on the right side went to urgent care they prescribed her Amoxil    Review of Systems  Constitutional: Negative for activity change and fever.  HENT: Positive for congestion, rhinorrhea and sore throat. Negative for ear pain.   Eyes: Negative for discharge.  Respiratory: Positive for cough. Negative for shortness of breath and wheezing.   Cardiovascular: Negative for chest pain.       Objective:   Physical Exam  Constitutional: She appears well-developed.  HENT:  Head: Normocephalic.  Right Ear: External ear normal.  Left Ear: External ear normal.  Nose: Nose normal.  Mouth/Throat: Oropharynx is clear and moist. No oropharyngeal exudate.  Eyes: Right eye exhibits no discharge. Left eye exhibits no discharge.  Neck: Neck supple. No tracheal deviation present.  Cardiovascular: Normal rate and normal heart sounds.  No murmur heard. Pulmonary/Chest: Effort normal and breath sounds normal. She has no wheezes. She has no rales.  Lymphadenopathy:    She has no cervical adenopathy.  Skin: Skin is warm and dry.  Nursing note and vitals reviewed.         Assessment & Plan:  Early right otitis media Right sinus infection No sign of the flu Patient may work if she feels up to it Patient Amoxil that the urgent care center prescribed is supple dosing I recommend stopping it.  I recommend instead Augmentin 875 1 twice daily for 10 days if this does not clear us clear her up to let us know

## 2018-04-20 ENCOUNTER — Ambulatory Visit: Payer: BC Managed Care – PPO | Admitting: Adult Health

## 2018-04-20 ENCOUNTER — Encounter: Payer: Self-pay | Admitting: Adult Health

## 2018-04-20 VITALS — BP 115/73 | HR 84 | Ht 59.0 in | Wt 164.0 lb

## 2018-04-20 DIAGNOSIS — B9689 Other specified bacterial agents as the cause of diseases classified elsewhere: Secondary | ICD-10-CM

## 2018-04-20 DIAGNOSIS — N760 Acute vaginitis: Secondary | ICD-10-CM | POA: Diagnosis not present

## 2018-04-20 DIAGNOSIS — L290 Pruritus ani: Secondary | ICD-10-CM

## 2018-04-20 DIAGNOSIS — N898 Other specified noninflammatory disorders of vagina: Secondary | ICD-10-CM

## 2018-04-20 DIAGNOSIS — Z113 Encounter for screening for infections with a predominantly sexual mode of transmission: Secondary | ICD-10-CM

## 2018-04-20 LAB — POCT WET PREP (WET MOUNT)
Clue Cells Wet Prep Whiff POC: NEGATIVE
WBC WET PREP: POSITIVE

## 2018-04-20 MED ORDER — METRONIDAZOLE 0.75 % VA GEL: 1.0000 | Freq: Every day | VAGINAL | 0 refills | Status: DC

## 2018-04-20 MED ORDER — HYDROCORTISONE ACE-PRAMOXINE 2.5-1 % RE CREA: 1.0000 "application " | TOPICAL_CREAM | Freq: Three times a day (TID) | RECTAL | 1 refills | Status: DC

## 2018-04-20 NOTE — Patient Instructions (Signed)
Bacterial Vaginosis Bacterial vaginosis is a vaginal infection that occurs when the normal balance of bacteria in the vagina is disrupted. It results from an overgrowth of certain bacteria. This is the most common vaginal infection among women ages 15-44. Because bacterial vaginosis increases your risk for STIs (sexually transmitted infections), getting treated can help reduce your risk for chlamydia, gonorrhea, herpes, and HIV (human immunodeficiency virus). Treatment is also important for preventing complications in pregnant women, because this condition can cause an early (premature) delivery. What are the causes? This condition is caused by an increase in harmful bacteria that are normally present in small amounts in the vagina. However, the reason that the condition develops is not fully understood. What increases the risk? The following factors may make you more likely to develop this condition:  Having a new sexual partner or multiple sexual partners.  Having unprotected sex.  Douching.  Having an intrauterine device (IUD).  Smoking.  Drug and alcohol abuse.  Taking certain antibiotic medicines.  Being pregnant.  You cannot get bacterial vaginosis from toilet seats, bedding, swimming pools, or contact with objects around you. What are the signs or symptoms? Symptoms of this condition include:  Grey or white vaginal discharge. The discharge can also be watery or foamy.  A fish-like odor with discharge, especially after sexual intercourse or during menstruation.  Itching in and around the vagina.  Burning or pain with urination.  Some women with bacterial vaginosis have no signs or symptoms. How is this diagnosed? This condition is diagnosed based on:  Your medical history.  A physical exam of the vagina.  Testing a sample of vaginal fluid under a microscope to look for a large amount of bad bacteria or abnormal cells. Your health care provider may use a cotton swab  or a small wooden spatula to collect the sample.  How is this treated? This condition is treated with antibiotics. These may be given as a pill, a vaginal cream, or a medicine that is put into the vagina (suppository). If the condition comes back after treatment, a second round of antibiotics may be needed. Follow these instructions at home: Medicines  Take over-the-counter and prescription medicines only as told by your health care provider.  Take or use your antibiotic as told by your health care provider. Do not stop taking or using the antibiotic even if you start to feel better. General instructions  If you have a female sexual partner, tell her that you have a vaginal infection. She should see her health care provider and be treated if she has symptoms. If you have a female sexual partner, he does not need treatment.  During treatment: ? Avoid sexual activity until you finish treatment. ? Do not douche. ? Avoid alcohol as directed by your health care provider. ? Avoid breastfeeding as directed by your health care provider.  Drink enough water and fluids to keep your urine clear or pale yellow.  Keep the area around your vagina and rectum clean. ? Wash the area daily with warm water. ? Wipe yourself from front to back after using the toilet.  Keep all follow-up visits as told by your health care provider. This is important. How is this prevented?  Do not douche.  Wash the outside of your vagina with warm water only.  Use protection when having sex. This includes latex condoms and dental dams.  Limit how many sexual partners you have. To help prevent bacterial vaginosis, it is best to have sex with just   one partner (monogamous).  Make sure you and your sexual partner are tested for STIs.  Wear cotton or cotton-lined underwear.  Avoid wearing tight pants and pantyhose, especially during summer.  Limit the amount of alcohol that you drink.  Do not use any products that  contain nicotine or tobacco, such as cigarettes and e-cigarettes. If you need help quitting, ask your health care provider.  Do not use illegal drugs. Where to find more information:  Centers for Disease Control and Prevention: www.cdc.gov/std  American Sexual Health Association (ASHA): www.ashastd.org  U.S. Department of Health and Human Services, Office on Women's Health: www.womenshealth.gov/ or https://www.womenshealth.gov/a-z-topics/bacterial-vaginosis Contact a health care provider if:  Your symptoms do not improve, even after treatment.  You have more discharge or pain when urinating.  You have a fever.  You have pain in your abdomen.  You have pain during sex.  You have vaginal bleeding between periods. Summary  Bacterial vaginosis is a vaginal infection that occurs when the normal balance of bacteria in the vagina is disrupted.  Because bacterial vaginosis increases your risk for STIs (sexually transmitted infections), getting treated can help reduce your risk for chlamydia, gonorrhea, herpes, and HIV (human immunodeficiency virus). Treatment is also important for preventing complications in pregnant women, because the condition can cause an early (premature) delivery.  This condition is treated with antibiotic medicines. These may be given as a pill, a vaginal cream, or a medicine that is put into the vagina (suppository). This information is not intended to replace advice given to you by your health care provider. Make sure you discuss any questions you have with your health care provider. Document Released: 11/01/2005 Document Revised: 03/07/2017 Document Reviewed: 07/17/2016 Elsevier Interactive Patient Education  2018 [employer]  

## 2018-04-20 NOTE — Progress Notes (Signed)
  Subjective:     Patient ID: Courtney Murray, female   DOB: 04-26-1991, 27 y.o.   MRN: [id number]  HPI Courtney Murray is a 27 year old black female in complaining of vaginal itching and rectal itching,has used new soap and had ne panties, has had current partner for 3 months.   Review of Systems +vaginal itching +rectal itching  Reviewed past medical,surgical, social and family history. Reviewed medications and allergies.     Objective:   Physical Exam BP 115/73 (BP Location: Left Arm, Patient Position: Sitting, Cuff Size: Small)   Pulse 84   Ht 4\' 11"  (1.499 m)   Wt 164 lb (74.4 kg)   BMI 33.12 kg/m  Skin warm and dry.Pelvic: external genitalia is normal in appearance no lesions, vagina: white discharge without odor,red side walls,urethra has no lesions or masses noted, cervix:smooth and bulbous, uterus: normal size, shape and contour, non tender, no masses felt, adnexa: no masses or tenderness noted. Bladder is non tender and no masses felt. Wet prep: + for clue cells and +WBCs. GC/CHL obtained.  Has hemorrhoid remnant,keep clean,try use wet wipes instead of toilet paper.Wash panties before wearing and stay with same soaps.     Assessment:     1. BV (bacterial vaginosis)   2. Vaginal discharge   3. Vaginal itching   4. Rectal itching   5. Screening examination for STD (sexually transmitted disease)       Plan:     GC/CHL sent Meds ordered this encounter  Medications  . metroNIDAZOLE (METROGEL) 0.75 % vaginal gel    Sig: Place 1 Applicatorful vaginally at bedtime. Use for 5 nights    Dispense:  70 g    Refill:  0    Order Specific Question:   Supervising Provider    Answer:   Despina HiddenEURE, LUTHER H [[id number]]  . hydrocortisone-pramoxine (ANALPRAM-HC) 2.5-1 % rectal cream    Sig: Place 1 application rectally 3 (three) times daily.    Dispense:  30 g    Refill:  1    Order Specific Question:   Supervising Provider    Answer:   Duane LopeEURE, LUTHER H [[id number]]  F/U prn

## 2018-04-22 LAB — GC/CHLAMYDIA PROBE AMP
Chlamydia trachomatis, NAA: NEGATIVE
Neisseria gonorrhoeae by PCR: NEGATIVE

## 2018-08-16 ENCOUNTER — Encounter: Payer: Self-pay | Admitting: Adult Health

## 2018-08-16 ENCOUNTER — Ambulatory Visit (INDEPENDENT_AMBULATORY_CARE_PROVIDER_SITE_OTHER): Payer: BC Managed Care – PPO | Admitting: Adult Health

## 2018-08-16 VITALS — BP 107/65 | HR 66 | Ht 59.0 in | Wt 165.0 lb

## 2018-08-16 DIAGNOSIS — Z3046 Encounter for surveillance of implantable subdermal contraceptive: Secondary | ICD-10-CM

## 2018-08-16 DIAGNOSIS — Z3049 Encounter for surveillance of other contraceptives: Secondary | ICD-10-CM | POA: Diagnosis not present

## 2018-08-16 DIAGNOSIS — Z30011 Encounter for initial prescription of contraceptive pills: Secondary | ICD-10-CM | POA: Diagnosis not present

## 2018-08-16 HISTORY — DX: Encounter for surveillance of implantable subdermal contraceptive: Z30.46

## 2018-08-16 HISTORY — DX: Encounter for initial prescription of contraceptive pills: Z30.011

## 2018-08-16 LAB — POCT URINE PREGNANCY: PREG TEST UR: NEGATIVE

## 2018-08-16 MED ORDER — NORETHIN-ETH ESTRAD-FE BIPHAS 1 MG-10 MCG / 10 MCG PO TABS: 1.0000 | ORAL_TABLET | Freq: Every day | ORAL | 11 refills | Status: DC

## 2018-08-16 NOTE — Patient Instructions (Addendum)
Use condoms x 4 weeks, keep clean and dry x 24 hours, no heavy lifting, keep steri strips on x 72 hours, Keep pressure dressing on x 24 hours. Follow up prn problems. Start OCs today  F/U in 3 months

## 2018-08-16 NOTE — Progress Notes (Signed)
  Subjective:     Patient ID: Courtney Murray, female   DOB: 1991-05-07, 27 y.o.   MRN: [id number]  HPI Courtney Murray is a 27 year old black female in to have nexplanon removed, wants to have a period again.Wants to try OCs.  Review of Systems For nexplanon removal No periods with nexplanon  Reviewed past medical,surgical, social and family history. Reviewed medications and allergies.     Objective:   Physical Exam BP 107/65 (BP Location: Right Arm, Patient Position: Sitting, Cuff Size: Normal)   Pulse 66   Ht 4\' 11"  (1.499 m)   Wt 165 lb (74.8 kg)   BMI 33.33 kg/m   UPT negative. Skin warm and dry.Lungs: clear to ausculation bilaterally. Cardiovascular: regular rate and rhythm. Consent signed, time out called. Left arm cleansed with betadine, and injected with 1.5 cc 1% lidocaine and waited til numb.Under sterile technique a #11 blade was used to make small vertical incision, and a curved forceps was used to easily remove rod. Steri strips applied. Pressure dressing applied. Will start OC today.    Assessment:     1. Encounter for Nexplanon removal   2. Encounter for initial prescription of contraceptive pills       Plan:     Use condoms x 4 weeks, keep clean and dry x 24 hours, no heavy lifting, keep steri strips on x 72 hours, Keep pressure dressing on x 24 hours. Follow up prn problems. Start lo loestrin today, 1 pack given Meds ordered this encounter  Medications  . Norethindrone-Ethinyl Estradiol-Fe Biphas (LO LOESTRIN FE) 1 MG-10 MCG / 10 MCG tablet    Sig: Take 1 tablet by mouth daily. Take 1 daily by mouth    Dispense:  1 Package    Refill:  11    BIN [id number], PCN [id number], GRP [id number]    Order Specific Question:   Supervising Provider    Answer:   Duane Lope H [[id number]]  F/U in 3 months

## 2018-09-16 ENCOUNTER — Encounter (HOSPITAL_COMMUNITY): Payer: Self-pay | Admitting: Emergency Medicine

## 2018-09-16 ENCOUNTER — Other Ambulatory Visit: Payer: Self-pay

## 2018-09-16 ENCOUNTER — Emergency Department (HOSPITAL_COMMUNITY)
Admission: EM | Admit: 2018-09-16 | Discharge: 2018-09-16 | Disposition: A | Discharge status: Home / Self Care | Payer: BC Managed Care – PPO | Attending: Emergency Medicine | Admitting: Emergency Medicine

## 2018-09-16 DIAGNOSIS — F17290 Nicotine dependence, other tobacco product, uncomplicated: Secondary | ICD-10-CM | POA: Diagnosis not present

## 2018-09-16 DIAGNOSIS — Z79899 Other long term (current) drug therapy: Secondary | ICD-10-CM | POA: Insufficient documentation

## 2018-09-16 DIAGNOSIS — M542 Cervicalgia: Secondary | ICD-10-CM | POA: Diagnosis not present

## 2018-09-16 MED ORDER — OXYCODONE-ACETAMINOPHEN 5-325 MG PO TABS
1.0000 | ORAL_TABLET | Freq: Once | ORAL | Status: AC
  Administered 2018-09-16: 1 via ORAL
  Filled 2018-09-16: qty 1

## 2018-09-16 MED ORDER — METHOCARBAMOL 500 MG PO TABS: 500.0000 mg | ORAL_TABLET | Freq: Two times a day (BID) | ORAL | 0 refills | Status: DC

## 2018-09-16 NOTE — ED Provider Notes (Signed)
[facility] EMERGENCY DEPARTMENT Provider Note   CSN: [id number] Arrival date & time: 09/16/18  1205     History   Chief Complaint Chief Complaint  Patient presents with  . Neck Pain    HPI Courtney Murray is a 27 y.o. female.  HPI   27 year old female presents today with complaints of right-sided neck pain.  Patient notes 4 days ago waking with pain in her right posterior neck.  She notes pain is worse with palpation, worse with movement of the neck in all directions.  She denies any radiation into the shoulder or arm, denies any distal neurological deficits, dizziness or headache.  She denies any trauma to the neck.  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  She reports taking ibuprofen once with no significant improvement in symptoms.  No fever.    Past Medical History:  Diagnosis Date  . Chlamydia   . Contraceptive management 05/09/2014  . Gestational diabetes   . Gestational diabetes mellitus, antepartum   . Gonorrhea in female   . Nexplanon in place 05/19/2016  . Pregnant 11/25/2014  . Vaginal itching 08/27/2014  . Yeast infection of the vagina 08/27/2014    Patient Active Problem List   Diagnosis Date Noted  . Encounter for initial prescription of contraceptive pills 08/16/2018  . Encounter for Nexplanon removal 08/16/2018  . Rectal itching 10/11/2017  . Nexplanon in place 05/19/2016  . History of gestational diabetes 04/21/2016  . Encounter for well woman exam with routine gynecological exam 09/23/2015  . 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 NO PAST SURGERIES       OB History    Gravida  2   Para  1   Term  1   Preterm      AB  1   Living  1     SAB      TAB  1   Ectopic      Multiple  0   Live Births  1            Home Medications    Prior to Admission medications   Medication Sig Start Date End Date Taking? Authorizing Provider  methocarbamol (ROBAXIN) 500 MG tablet Take 1 tablet  (500 mg total) by mouth 2 (two) times daily. 09/16/18   Dennie Vecchio, Tinnie Gens, PA-C  Norethindrone-Ethinyl Estradiol-Fe Biphas (LO LOESTRIN FE) 1 MG-10 MCG / 10 MCG tablet Take 1 tablet by mouth daily. Take 1 daily by mouth 08/16/18   Adline Potter, NP    Family History Family History  Problem Relation Age of Onset  . Stroke Paternal Grandmother   . Heart disease Paternal Grandmother   . Diabetes Maternal Grandmother        borderline  . Thyroid disease Maternal Grandmother   . Stroke Maternal Grandmother   . Other Maternal Grandmother        blood clots  . Sleep apnea Father   . Diabetes Mother        borderline  . Sleep apnea Brother     Social History Social History   Tobacco Use  . Smoking status: Current Every Day Smoker    Types: Cigars  . Smokeless tobacco: Never Used  Substance Use Topics  . Alcohol use: No  . Drug use: No     Allergies   Patient has no known allergies.   Review of Systems Review of Systems  All other systems reviewed and are negative.  Physical Exam Updated Vital Signs BP 138/83 (BP Location: Right Arm)   Pulse 65   Temp 98.6 F (37 C) (Oral)   Resp 16   Ht 4\' 11"  (1.499 m)   Wt 74.8 kg   LMP 09/14/2018   SpO2 99%   BMI 33.33 kg/m   Physical Exam  Constitutional: She is oriented to person, place, and time. She appears well-developed and well-nourished.  HENT:  Head: Normocephalic and atraumatic.  Eyes: Pupils are equal, round, and reactive to light. Conjunctivae are normal. Right eye exhibits no discharge. Left eye exhibits no discharge. No scleral icterus.  Neck: Normal range of motion. No JVD present. No tracheal deviation present.  Neck is supple  Pulmonary/Chest: Effort normal. No stridor.  Musculoskeletal:  Tenderness palpation of right lateral cervical musculature at C6-C7, right trapezius tenderness-pain worse with all range of motion of the neck, no meningeal signs, pain is also increased with axial load, no  radicular findings  Neurological: She is alert and oriented to person, place, and time. Coordination normal.  Psychiatric: She has a normal mood and affect. Her behavior is normal. Judgment and thought content normal.  Nursing note and vitals reviewed.    ED Treatments / Results  Labs (all labs ordered are listed, but only abnormal results are displayed) Labs Reviewed - No data to display  EKG None  Radiology No results found.  Procedures Procedures (including critical care time)  Medications Ordered in ED Medications  oxyCODONE-acetaminophen (PERCOCET/ROXICET) 5-325 MG per tablet 1 tablet (1 tablet Oral Given 09/16/18 1232)     Initial Impression / Assessment and Plan / ED Course  I have reviewed the triage vital signs and the nursing notes.  Pertinent labs & imaging results that were available during my care of the patient were reviewed by me and considered in my medical decision making (see chart for details).     27 year old female presents today with neck pain.  This is likely musculature, some suspicion for nerve involvement.  She has no radicular symptoms, no symptoms that would indicate vascular etiology including dissection.  No signs of infectious etiology including meningitis.  Patient given pain medicine here discharge her muscle relaxers, she return if symptoms worsen or do not improve.  She verbalized understanding and agreement to today's plan had no further questions or concerns.  Final Clinical Impressions(s) / ED Diagnoses   Final diagnoses:  Neck pain    ED Discharge Orders         Ordered    methocarbamol (ROBAXIN) 500 MG tablet  2 times daily     09/16/18 1230           Eyvonne Mechanic, PA-C 09/16/18 1237    Bethann Berkshire, MD 09/16/18 1321

## 2018-09-16 NOTE — Discharge Instructions (Addendum)
Please read attached information. If you experience any new or worsening signs or symptoms please return to the emergency room for evaluation. Please follow-up with your primary care provider or specialist as discussed. Please use medication prescribed only as directed and discontinue taking if you have any concerning signs or symptoms.   °

## 2018-09-16 NOTE — ED Triage Notes (Signed)
Patient c/o pain in right side of neck that radiates into shoulder. Per patient started Tuesday after waking and is progressively getting worse. Patient states that she took ibuprofen last night with no relief. Patient states difficulty with turning her neck. Denies any fevers or other symptoms.

## 2018-09-18 ENCOUNTER — Telehealth: Payer: Self-pay

## 2018-09-18 NOTE — Telephone Encounter (Signed)
Patient called stating she had been to the ed on Nov 2,2019 with neck paint they told her she had a pinched nerve or she strained something in her neck they gave her a muxcle relaxer and told her to take Ibuprofen and Tylenol. She has been using a heating pad also. She says pain is a little better,but not much. I advised we have no available appt left for the day. If she needed to be seen today she would have to go to the urgent care or Ed. Pt states understanding.

## 2018-11-16 ENCOUNTER — Ambulatory Visit (INDEPENDENT_AMBULATORY_CARE_PROVIDER_SITE_OTHER): Payer: BC Managed Care – PPO | Admitting: Adult Health

## 2018-11-16 ENCOUNTER — Encounter: Payer: Self-pay | Admitting: Adult Health

## 2018-11-16 VITALS — BP 119/70 | HR 79 | Ht 59.0 in | Wt 165.0 lb

## 2018-11-16 DIAGNOSIS — J029 Acute pharyngitis, unspecified: Secondary | ICD-10-CM

## 2018-11-16 DIAGNOSIS — Z3041 Encounter for surveillance of contraceptive pills: Secondary | ICD-10-CM | POA: Insufficient documentation

## 2018-11-16 DIAGNOSIS — N921 Excessive and frequent menstruation with irregular cycle: Secondary | ICD-10-CM

## 2018-11-16 HISTORY — DX: Acute pharyngitis, unspecified: J02.9

## 2018-11-16 HISTORY — DX: Encounter for surveillance of contraceptive pills: Z30.41

## 2018-11-16 NOTE — Progress Notes (Signed)
Patient ID: Courtney Murray, female   DOB: [birth year], 28 y.o.   MRN: [id number] History of Present Illness: Courtney Murray is a 28 year old black female, back in follow up on having nexplanon removed 08/16/18 and starting lo loestrin, likes the pills but has had some BTB, mid pack.    Current Medications, Allergies, Past Medical History, Past Surgical History, Family History and Social History were reviewed in [employer] record.     Review of Systems: Has some BTB mid pack, but happy with OCs Has sore throat today     Physical Exam:BP 119/70 (BP Location: Left Arm, Patient Position: Sitting, Cuff Size: Normal)   Pulse 79   Ht 4\' 11"  (1.499 m)   Wt 165 lb (74.8 kg)   LMP 11/06/2018   BMI 33.33 kg/m  General:  Well developed, well nourished, no acute distress Skin:  Warm and dry Throat: no redness, swelling or pustules  Lungs; Clear to auscultation bilaterally Cardiovascular: Regular rate and rhythm  Psych:  No mood changes, alert and cooperative,seems happy Fall risk is low.   Impression: 1. Encounter for surveillance of contraceptive pills   2. Irregular intermenstrual bleeding   3. Sore throat       Plan: Continue OCs, has refills Try warm salt water gargles, and zyrtec or Claritin Return in July for pap and physical

## 2019-02-19 ENCOUNTER — Telehealth: Payer: Self-pay | Admitting: Adult Health

## 2019-02-19 MED ORDER — FLUCONAZOLE 150 MG PO TABS: ORAL_TABLET | ORAL | 1 refills | Status: DC

## 2019-02-19 NOTE — Telephone Encounter (Signed)
Patient called stating that she has a yeast infection. Please contact pt

## 2019-02-19 NOTE — Telephone Encounter (Signed)
Will rx diflucan  

## 2019-02-19 NOTE — Telephone Encounter (Signed)
Patient reports that she is experiencing itching in the vaginal area. She also feels irritated. Advised patient that I will send message to Victorino Dike and to check with pharmacy later today.

## 2019-02-19 NOTE — Addendum Note (Signed)
Addended by: Cyril Mourning A on: 02/19/2019 10:55 AM   Modules accepted: Orders

## 2019-05-09 ENCOUNTER — Other Ambulatory Visit: Payer: Self-pay

## 2019-05-09 ENCOUNTER — Ambulatory Visit (INDEPENDENT_AMBULATORY_CARE_PROVIDER_SITE_OTHER): Payer: BC Managed Care – PPO | Admitting: Adult Health

## 2019-05-09 ENCOUNTER — Telehealth: Payer: Self-pay | Admitting: Adult Health

## 2019-05-09 ENCOUNTER — Encounter: Payer: Self-pay | Admitting: Adult Health

## 2019-05-09 VITALS — BP 114/79 | HR 77 | Ht <= 58 in | Wt 171.0 lb

## 2019-05-09 DIAGNOSIS — R102 Pelvic and perineal pain unspecified side: Secondary | ICD-10-CM | POA: Insufficient documentation

## 2019-05-09 DIAGNOSIS — Z3202 Encounter for pregnancy test, result negative: Secondary | ICD-10-CM | POA: Insufficient documentation

## 2019-05-09 DIAGNOSIS — M545 Low back pain, unspecified: Secondary | ICD-10-CM | POA: Insufficient documentation

## 2019-05-09 HISTORY — DX: Encounter for pregnancy test, result negative: Z32.02

## 2019-05-09 LAB — POCT URINALYSIS DIPSTICK
Blood, UA: NEGATIVE
Glucose, UA: NEGATIVE
Ketones, UA: NEGATIVE
Nitrite, UA: NEGATIVE
Protein, UA: NEGATIVE

## 2019-05-09 LAB — POCT URINE PREGNANCY: Preg Test, Ur: NEGATIVE

## 2019-05-09 MED ORDER — CYCLOBENZAPRINE HCL 10 MG PO TABS: 10.0000 mg | ORAL_TABLET | Freq: Three times a day (TID) | ORAL | 0 refills | Status: DC | PRN

## 2019-05-09 NOTE — Telephone Encounter (Signed)
Pt having low back pain and pelvic pain. Period is 7 days late. Negative pregnancy tests. No urinary symptoms. Advised can schedule an appt. Attempted to transfer call to front desk but advised if we got disconnected, call office back for appt. Pt voiced understanding. Toco

## 2019-05-09 NOTE — Telephone Encounter (Signed)
Pt would like to talk to a nurse about some pain she is having

## 2019-05-09 NOTE — Patient Instructions (Signed)
Muscle Cramps and Spasms Muscle cramps and spasms occur when a muscle or muscles tighten and you have no control over this tightening (involuntary muscle contraction). They are a common problem and can develop in any muscle. The most common place is in the calf muscles of the leg. Muscle cramps and muscle spasms are both involuntary muscle contractions, but there are some differences between the two:  Muscle cramps are painful. They come and go and may last for a few seconds or up to 15 minutes. Muscle cramps are often more forceful and last longer than muscle spasms.  Muscle spasms may or may not be painful. They may also last just a few seconds or much longer. Certain medical conditions, such as diabetes or Parkinson's disease, can make it more likely to develop cramps or spasms. However, cramps or spasms are usually not caused by a serious underlying problem. Common causes include:  Doing more physical work or exercise than your body is ready for (overexertion).  Overuse from repeating certain movements too many times.  Remaining in a certain position for a long period of time.  Improper preparation, form, or technique while playing a sport or doing an activity.  Dehydration.  Injury.  Side effects of some medicines.  Abnormally low levels of the salts and minerals in your blood (electrolytes), especially potassium and calcium. This could happen if you are taking water pills (diuretics) or if you are pregnant. In many cases, the cause of muscle cramps or spasms is not known. Follow these instructions at home: Managing pain and stiffness      Try massaging, stretching, and relaxing the affected muscle. Do this for several minutes at a time.  If directed, apply heat to tight or tense muscles as often as told by your health care provider. Use the heat source that your health care provider recommends, such as a moist heat pack or a heating pad. ? Place a towel between your skin and  the heat source. ? Leave the heat on for 20-30 minutes. ? Remove the heat if your skin turns bright red. This is especially important if you are unable to feel pain, heat, or cold. You may have a greater risk of getting burned.  If directed, put ice on the affected area. This may help if you are sore or have pain after a cramp or spasm. ? Put ice in a plastic bag. ? Place a towel between your skin and the bag. ? Leavethe ice on for 20 minutes, 2-3 times a day.  Try taking hot showers or baths to help relax tight muscles. Eating and drinking  Drink enough fluid to keep your urine pale yellow. Staying well hydrated may help prevent cramps or spasms.  Eat a healthy diet that includes plenty of nutrients to help your muscles function. A healthy diet includes fruits and vegetables, lean protein, whole grains, and low-fat or nonfat dairy products. General instructions  If you are having frequent cramps, avoid intense exercise for several days.  Take over-the-counter and prescription medicines only as told by your health care provider.  Pay attention to any changes in your symptoms.  Keep all follow-up visits as told by your health care provider. This is important. Contact a health care provider if:  Your cramps or spasms get more severe or happen more often.  Your cramps or spasms do not improve over time. Summary  Muscle cramps and spasms occur when a muscle or muscles tighten and you have no control over this   tightening (involuntary muscle contraction).  The most common place for cramps or spasms to occur is in the calf muscles of the leg.  Massaging, stretching, and relaxing the affected muscle may relieve the cramp or spasm.  Drink enough fluid to keep your urine pale yellow. Staying well hydrated may help prevent cramps or spasms. This information is not intended to replace advice given to you by your health care provider. Make sure you discuss any questions you have with your  health care provider. Document Released: 04/23/2002 Document Revised: 03/27/2018 Document Reviewed: 03/27/2018 Elsevier Interactive Patient Education  2019 Elsevier Inc.  

## 2019-05-09 NOTE — Progress Notes (Signed)
Patient ID: Courtney Murray, female   DOB: 11/28/1990, 28 y.o.   MRN: [id number] History of Present Illness: Fynley is a  28 year old black female, G2P1 in complaining of low back pain for 3 days, worse yesterday and hurts to pelvic area, and period 6 days late.Pain worse yesterday and she did move from apartment to house last weeks and was climbing stairs and bending alot PCP is [facility]   Current Medications, Allergies, Past Medical History, Past Surgical History, Family History and Social History were reviewed in [employer] record.     Review of Systems: +Low back pain  +pelvic pain Period 6 days late on Lo Loestrin, prior 2 periods only 2-3 days and light     Physical Exam:BP 114/79 (BP Location: Left Arm, Patient Position: Sitting, Cuff Size: Normal)   Pulse 77   Ht 4\' 10"  (1.473 m)   Wt 171 lb (77.6 kg)   LMP 04/16/2019   BMI 35.74 kg/m   Urine is 1+leuks UPT is negative.  General:  Well developed, well nourished, no acute distress Skin:  Warm and dry Abdomen:  Soft, non tender, no hepatosplenomegaly, No CVAT Has pain right lower back to palpation and along spine Pelvic:  External genitalia is normal in appearance, no lesions.  The vagina is normal in appearance. Urethra has no lesions or masses. The cervix is bulbous.no CMT  Uterus is felt to be normal size, shape, and contour.  No adnexal masses, +right side tenderness noted.Bladder is non tender, no masses  +pain in back with leg lifts and if bends to touch toes Psych:  No mood changes, alert and cooperative,seems happy Fall risk is low. Examination chaperoned by Levy Pupa LPN Discussed not uncoomon to light short to no period on lo Loestrin.   Impression: 1. Bilateral low back pain, unspecified chronicity, unspecified whether sciatica present   2. Pregnancy examination or test, negative result   3. Pelvic pain       Plan: Continue OCs Push fluids Use ice Rest and will rx  flexeril Meds ordered this encounter  Medications  . cyclobenzaprine (FLEXERIL) 10 MG tablet    Sig: Take 1 tablet (10 mg total) by mouth 3 (three) times daily as needed for muscle spasms.    Dispense:  30 tablet    Refill:  0    Order Specific Question:   Supervising Provider    Answer:   Florian Buff [[id number]]  Follow up 6/29, if not better will get Xrays Review handout on muscle spasms

## 2019-05-24 ENCOUNTER — Other Ambulatory Visit (HOSPITAL_COMMUNITY)
Admission: RE | Admit: 2019-05-24 | Discharge: 2019-05-24 | Disposition: A | Discharge status: Home / Self Care | Payer: BC Managed Care – PPO | Source: Ambulatory Visit | Attending: Adult Health | Admitting: Adult Health

## 2019-05-24 ENCOUNTER — Ambulatory Visit (INDEPENDENT_AMBULATORY_CARE_PROVIDER_SITE_OTHER): Payer: BC Managed Care – PPO | Admitting: Adult Health

## 2019-05-24 ENCOUNTER — Other Ambulatory Visit: Payer: Self-pay

## 2019-05-24 ENCOUNTER — Encounter: Payer: Self-pay | Admitting: Adult Health

## 2019-05-24 VITALS — BP 110/70 | HR 71 | Ht 59.0 in | Wt 168.8 lb

## 2019-05-24 DIAGNOSIS — Z01419 Encounter for gynecological examination (general) (routine) without abnormal findings: Secondary | ICD-10-CM | POA: Insufficient documentation

## 2019-05-24 DIAGNOSIS — Z3041 Encounter for surveillance of contraceptive pills: Secondary | ICD-10-CM

## 2019-05-24 DIAGNOSIS — N6322 Unspecified lump in the left breast, upper inner quadrant: Secondary | ICD-10-CM | POA: Insufficient documentation

## 2019-05-24 HISTORY — DX: Encounter for gynecological examination (general) (routine) without abnormal findings: Z01.419

## 2019-05-24 MED ORDER — LO LOESTRIN FE 1 MG-10 MCG / 10 MCG PO TABS: 1.0000 | ORAL_TABLET | Freq: Every day | ORAL | 4 refills | Status: DC

## 2019-05-24 NOTE — Addendum Note (Signed)
Addended by: Diona Fanti A on: 05/24/2019 12:50 PM   Modules accepted: Orders

## 2019-05-24 NOTE — Progress Notes (Signed)
Patient ID: Courtney Murray, female   DOB: 1991-01-24, 28 y.o.   MRN: [id number] History of Present Illness: Courtney Murray is a 28 year old black female, single, G2P1011, in for a well woman gyn exam and pap. PCP is Dr Sallee Lange.    Current Medications, Allergies, Past Medical History, Past Surgical History, Family History and Social History were reviewed in [employer] record.     Review of Systems:  Patient denies any headaches, hearing loss, fatigue, blurred vision, shortness of breath, chest pain, abdominal pain, problems with bowel movements, urination, or intercourse. No joint pain or mood swings. Has knot left breast and has had pain,had one years ago    Physical Exam:BP 110/70 (BP Location: Right Arm, Patient Position: Sitting, Cuff Size: Normal)   Pulse 71   Ht 4\' 11"  (1.499 m)   Wt 168 lb 12.8 oz (76.6 kg)   LMP 05/10/2019   BMI 34.09 kg/m  General:  Well developed, well nourished, no acute distress Skin:  Warm and dry Neck:  Midline trachea, normal thyroid, good ROM, no lymphadenopathy Lungs; Clear to auscultation bilaterally Breast:  No dominant palpable mass, retraction, or nipple discharge on the right, but has 1 cm, firm tender nodule at 11 0'clock, 7 FB from areola, no retraction or nipple discharge  Cardiovascular: Regular rate and rhythm Abdomen:  Soft, non tender, no hepatosplenomegaly Pelvic:  External genitalia is normal in appearance, no lesions.  The vagina is normal in appearance. Urethra has no lesions or masses. The cervix is bulbous.Pap with HPV reflex and GC/CHL performed.  Uterus is felt to be normal size, shape, and contour.  No adnexal masses or tenderness noted.Bladder is non tender, no masses felt. Extremities/musculoskeletal:  No swelling or varicosities noted, no clubbing or cyanosis Psych:  No mood changes, alert and cooperative,seems happy PHQ 9 score is 1. Fall risk is low. Examination chaperoned by Levy Pupa LPN. Trying to  lose weight.    Impression: 1. Encounter for gynecological examination with Papanicolaou smear of cervix   2. Encounter for surveillance of contraceptive pills   3. Mass of upper inner quadrant of left breast       Plan: Continue OCs Meds ordered this encounter  Medications  . Norethindrone-Ethinyl Estradiol-Fe Biphas (LO LOESTRIN FE) 1 MG-10 MCG / 10 MCG tablet    Sig: Take 1 tablet by mouth daily. Take 1 daily by mouth    Dispense:  3 Package    Refill:  4    BIN [id number], PCN [id number], GRP [id number]    Order Specific Question:   Supervising Provider    Answer:   Florian Buff [[id number]]   Physical in 1 year  Pap in 3 if normal Left breast US at [facility] 7/21 at 3:50 pm  Labs with PCP

## 2019-05-25 LAB — CYTOLOGY - PAP
Chlamydia: NEGATIVE
Diagnosis: NEGATIVE
Neisseria Gonorrhea: NEGATIVE

## 2019-06-05 ENCOUNTER — Ambulatory Visit (HOSPITAL_COMMUNITY)
Admission: RE | Admit: 2019-06-05 | Discharge: 2019-06-05 | Disposition: A | Discharge status: Home / Self Care | Payer: BC Managed Care – PPO | Source: Ambulatory Visit | Attending: Adult Health | Admitting: Adult Health

## 2019-06-05 ENCOUNTER — Other Ambulatory Visit: Payer: Self-pay

## 2019-06-05 DIAGNOSIS — N6322 Unspecified lump in the left breast, upper inner quadrant: Secondary | ICD-10-CM

## 2019-06-14 ENCOUNTER — Other Ambulatory Visit: Payer: Self-pay

## 2019-06-14 ENCOUNTER — Other Ambulatory Visit: Payer: BC Managed Care – PPO

## 2019-06-14 DIAGNOSIS — Z20822 Contact with and (suspected) exposure to covid-19: Secondary | ICD-10-CM

## 2019-06-17 LAB — NOVEL CORONAVIRUS, NAA: SARS-CoV-2, NAA: NOT DETECTED

## 2019-08-08 ENCOUNTER — Encounter: Payer: Self-pay | Admitting: *Deleted

## 2019-08-08 ENCOUNTER — Other Ambulatory Visit: Payer: Self-pay | Admitting: Adult Health

## 2019-10-02 ENCOUNTER — Other Ambulatory Visit: Payer: Self-pay | Admitting: Adult Health

## 2019-10-03 ENCOUNTER — Telehealth: Payer: Self-pay | Admitting: *Deleted

## 2019-10-03 NOTE — Telephone Encounter (Signed)
Pt aware note sent.

## 2019-10-03 NOTE — Telephone Encounter (Signed)
Pt is having muscle spasms in her back. Pt had to leave work early yesterday. Pt is taking Flexeril. Pt is requesting a work note for today. If approved, please send to her MyChart. Thanks!! [place]

## 2019-10-03 NOTE — Telephone Encounter (Signed)
Pt left message that she wants a note for work.

## 2019-11-05 ENCOUNTER — Other Ambulatory Visit: Payer: Self-pay

## 2019-11-05 ENCOUNTER — Ambulatory Visit
Admission: EM | Admit: 2019-11-05 | Discharge: 2019-11-05 | Disposition: A | Discharge status: Home / Self Care | Payer: BC Managed Care – PPO | Attending: Emergency Medicine | Admitting: Emergency Medicine

## 2019-11-05 DIAGNOSIS — Z202 Contact with and (suspected) exposure to infections with a predominantly sexual mode of transmission: Secondary | ICD-10-CM

## 2019-11-05 DIAGNOSIS — Z113 Encounter for screening for infections with a predominantly sexual mode of transmission: Secondary | ICD-10-CM | POA: Diagnosis not present

## 2019-11-05 MED ORDER — AZITHROMYCIN 500 MG PO TABS
1000.0000 mg | ORAL_TABLET | Freq: Once | ORAL | Status: AC
  Administered 2019-11-05: 16:00:00 1000 mg via ORAL

## 2019-11-05 MED ORDER — CEFTRIAXONE SODIUM 250 MG IJ SOLR
250.0000 mg | Freq: Once | INTRAMUSCULAR | Status: AC
  Administered 2019-11-05: 250 mg via INTRAMUSCULAR

## 2019-11-05 NOTE — ED Triage Notes (Signed)
Pt presents to UC stating she would like a STI check. Pt denies any symptoms at this time. Pt states her partner tested positive for chlamydia and/or gonorrhea.

## 2019-11-05 NOTE — ED Provider Notes (Signed)
HPI  SUBJECTIVE:  Courtney Murray is a 28 y.o. female who presents with concerns for exposure to gonorrhea and chlamydia.  States that her female partner was diagnosed with urethritis today and was tested for STDs.  These are pending.  They have been together for 2 months.  She denies any symptoms, any other sexual partners.  She states that her current partner denies having any other sexual partners.  She denies any symptoms.  She has a past medical history of HSV (seropositive), BV.  Denies history of gonorrhea, chlamydia, HIV, syphilis, trichomonas, yeast infections although Epic lists gonorrhea and yeast infection as previous diagnosis.  LMP: Last week.  Denies possibility of being pregnant.  PMD: Kathyrn Drown, MD   Past Medical History:  Diagnosis Date  . Chlamydia   . Contraceptive management 05/09/2014  . Gestational diabetes   . Gestational diabetes mellitus, antepartum   . Gonorrhea in female   . Nexplanon in place 05/19/2016  . Pregnant 11/25/2014  . Vaginal itching 08/27/2014  . Yeast infection of the vagina 08/27/2014    Past Surgical History:  Procedure Laterality Date  . NO PAST SURGERIES      Family History  Problem Relation Age of Onset  . Stroke Paternal Grandmother   . Heart disease Paternal Grandmother   . Diabetes Maternal Grandmother        borderline  . Thyroid disease Maternal Grandmother   . Stroke Maternal Grandmother   . Other Maternal Grandmother        blood clots  . Sleep apnea Father   . Diabetes Mother        borderline  . Sleep apnea Brother     Social History   Tobacco Use  . Smoking status: Current Every Day Smoker    Types: Cigars  . Smokeless tobacco: Never Used  Substance Use Topics  . Alcohol use: No  . Drug use: No     Current Facility-Administered Medications:  .  azithromycin (ZITHROMAX) tablet 1,000 mg, 1,000 mg, Oral, Once, Melynda Ripple, MD .  cefTRIAXone (ROCEPHIN) injection 250 mg, 250 mg, Intramuscular, Once,  Melynda Ripple, MD  Current Outpatient Medications:  .  cyclobenzaprine (FLEXERIL) 10 MG tablet, TAKE 1 TABLET(10 MG) BY MOUTH THREE TIMES DAILY AS NEEDED FOR MUSCLE SPASMS, Disp: 30 tablet, Rfl: 0 .  LO LOESTRIN FE 1 MG-10 MCG / 10 MCG tablet, TAKE 1 TABLET BY MOUTH DAILY, Disp: 28 tablet, Rfl: 3  No Known Allergies   ROS  As noted in HPI.   Physical Exam  BP 122/84 (BP Location: Right Arm)   Pulse 76   Temp 98.1 F (36.7 C) (Oral)   Resp 16   SpO2 97%   Constitutional: Well developed, well nourished, no acute distress Eyes:  EOMI, conjunctiva normal bilaterally HENT: Normocephalic, atraumatic,mucus membranes moist Respiratory: Normal inspiratory effort Cardiovascular: Normal rate GI: nondistended soft nontender Back: No CVAT skin: No rash, skin intact Musculoskeletal: no deformities Neurologic: Alert & oriented x 3, no focal neuro deficits Psychiatric: Speech and behavior appropriate   ED Course   Medications  cefTRIAXone (ROCEPHIN) injection 250 mg (has no administration in time range)  azithromycin (ZITHROMAX) tablet 1,000 mg (has no administration in time range)    No orders of the defined types were placed in this encounter.   No results found for this or any previous visit (from the past 24 hour(s)). No results found.  ED Clinical Impression  1. Exposure to sexually transmitted disease (STD)  2. Screening examination for STD (sexually transmitted disease)      ED Assessment/Plan  GC, chlamydia, trichomonas, BV, yeast sent.  Patient would like empiric treatment for gonorrhea and chlamydia.  Giving 1000 mg azithromycin po and 250 mg of Rocephin IM.  Follow-up with PMD as needed.  Advised condom use.  Discussed labs, imaging, MDM, treatment plan, and plan for follow-up with patient. Discussed sn/sx that should prompt return to the ED. patient agrees with plan.   Meds ordered this encounter  Medications  . cefTRIAXone (ROCEPHIN) injection 250 mg     Order Specific Question:   Antibiotic Indication:    Answer:   STD  . azithromycin (ZITHROMAX) tablet 1,000 mg    *This clinic note was created using Scientist, clinical (histocompatibility and immunogenetics). Therefore, there may be occasional mistakes despite careful proofreading.   ?    Domenick Gong, MD 11/07/19 1007

## 2019-11-05 NOTE — Discharge Instructions (Addendum)
We are treating you empirically for gonorrhea and chlamydia today with Rocephin and azithromycin.  No intercourse for 10 days or until you know that your results are negative.

## 2019-11-07 ENCOUNTER — Telehealth (HOSPITAL_COMMUNITY): Payer: Self-pay | Admitting: Emergency Medicine

## 2019-11-07 DIAGNOSIS — B9689 Other specified bacterial agents as the cause of diseases classified elsewhere: Secondary | ICD-10-CM

## 2019-11-07 DIAGNOSIS — N760 Acute vaginitis: Secondary | ICD-10-CM

## 2019-11-07 LAB — CERVICOVAGINAL ANCILLARY ONLY
Bacterial vaginitis: POSITIVE — AB
Candida vaginitis: NEGATIVE
Chlamydia: NEGATIVE
Neisseria Gonorrhea: NEGATIVE
Trichomonas: NEGATIVE

## 2019-11-07 MED ORDER — METRONIDAZOLE 500 MG PO TABS: 500.0000 mg | ORAL_TABLET | Freq: Two times a day (BID) | ORAL | 0 refills | Status: AC

## 2019-11-07 NOTE — Telephone Encounter (Signed)
Patient has BV on recent STD testing.  Will E prescribe 500 mg of Flagyl p.o. twice daily for 7 days to pharmacy on record.  Notified patient via MyChart of test results and of prescription for her to pick up.

## 2020-02-21 ENCOUNTER — Ambulatory Visit (INDEPENDENT_AMBULATORY_CARE_PROVIDER_SITE_OTHER): Payer: BC Managed Care – PPO | Admitting: Adult Health

## 2020-02-21 ENCOUNTER — Encounter: Payer: Self-pay | Admitting: Adult Health

## 2020-02-21 ENCOUNTER — Other Ambulatory Visit: Payer: Self-pay

## 2020-02-21 VITALS — BP 117/77 | HR 89 | Ht 59.0 in | Wt 180.0 lb

## 2020-02-21 DIAGNOSIS — Z3202 Encounter for pregnancy test, result negative: Secondary | ICD-10-CM | POA: Diagnosis not present

## 2020-02-21 DIAGNOSIS — Z3041 Encounter for surveillance of contraceptive pills: Secondary | ICD-10-CM

## 2020-02-21 DIAGNOSIS — N926 Irregular menstruation, unspecified: Secondary | ICD-10-CM | POA: Insufficient documentation

## 2020-02-21 DIAGNOSIS — L509 Urticaria, unspecified: Secondary | ICD-10-CM | POA: Diagnosis not present

## 2020-02-21 DIAGNOSIS — R635 Abnormal weight gain: Secondary | ICD-10-CM | POA: Insufficient documentation

## 2020-02-21 LAB — POCT URINE PREGNANCY: Preg Test, Ur: NEGATIVE

## 2020-02-21 MED ORDER — NORETHINDRONE ACET-ETHINYL EST 1-20 MG-MCG PO TABS: 1.0000 | ORAL_TABLET | Freq: Every day | ORAL | 11 refills | Status: DC

## 2020-02-21 MED ORDER — CETIRIZINE HCL 10 MG PO TABS: 10.0000 mg | ORAL_TABLET | Freq: Every day | ORAL | Status: DC

## 2020-02-21 NOTE — Progress Notes (Addendum)
  Subjective:     Patient ID: Courtney Murray, female   DOB: 1991/03/07, 29 y.o.   MRN: [id number]  HPI Courtney Murray is a 89 year is a 29 year old black female, G2P1011, in complaining of no period since January has had negative HPTs and is having itching and whelps.  PCP is [facility].   Review of Systems No period since January is on lo loestrin +cramps, but no bleeding Has itching and then whelps, has picture on phone, looks like hives  +weight gain  Reviewed past medical,surgical, social and family history. Reviewed medications and allergies.     Objective:   Physical Exam BP 117/77 (BP Location: Left Arm, Patient Position: Sitting, Cuff Size: Normal)   Pulse 89   Ht 4\' 11"  (1.499 m)   Wt 180 lb (81.6 kg)   LMP 12/12/2019   BMI 36.36 kg/m UPT is negative Skin warm and dry. Lungs: clear to ausculation bilaterally. Cardiovascular: regular rate and rhythm.    Assessment:     1. Missed periods Can change OCs if desired  Check CBC  2. Encounter for surveillance of contraceptive pills Finish current pack of lo loestrin then start Junel 1/20 Meds ordered this encounter  Medications  . norethindrone-ethinyl estradiol (LOESTRIN) 1-20 MG-MCG tablet    Sig: Take 1 tablet by mouth daily.    Dispense:  1 Package    Refill:  11    Order Specific Question:   Supervising Provider    Answer:   2/20, LUTHER H [[id number]]  . cetirizine (ZYRTEC ALLERGY) 10 MG tablet    Sig: Take 1 tablet (10 mg total) by mouth daily.    Dispense:       Order Specific Question:   Supervising Provider    Answer:   Despina Hidden H [[id number]]    3. Weight gain Check TSH and free T4 and CMP  4. Hives Can be stressed related, or environmental or even thyroid Try zyrtec Check ANA.TSH and free T4    Plan:     Follow up in 3 months

## 2020-02-22 LAB — COMPREHENSIVE METABOLIC PANEL
ALT: 17 IU/L (ref 0–32)
AST: 13 IU/L (ref 0–40)
Albumin/Globulin Ratio: 1.3 (ref 1.2–2.2)
Albumin: 3.8 g/dL — ABNORMAL LOW (ref 3.9–5.0)
Alkaline Phosphatase: 77 IU/L (ref 39–117)
BUN/Creatinine Ratio: 10 (ref 9–23)
BUN: 8 mg/dL (ref 6–20)
Bilirubin Total: 0.3 mg/dL (ref 0.0–1.2)
CO2: 22 mmol/L (ref 20–29)
Calcium: 9.2 mg/dL (ref 8.7–10.2)
Chloride: 103 mmol/L (ref 96–106)
Creatinine, Ser: 0.83 mg/dL (ref 0.57–1.00)
GFR calc Af Amer: 110 mL/min/{1.73_m2} (ref >59)
GFR calc non Af Amer: 96 mL/min/{1.73_m2} (ref >59)
Globulin, Total: 2.9 g/dL (ref 1.5–4.5)
Glucose: 86 mg/dL (ref 65–99)
Potassium: 4.9 mmol/L (ref 3.5–5.2)
Sodium: 137 mmol/L (ref 134–144)
Total Protein: 6.7 g/dL (ref 6.0–8.5)

## 2020-02-22 LAB — TSH: TSH: 1.5 u[IU]/mL (ref 0.450–4.500)

## 2020-02-22 LAB — CBC
Hematocrit: 42.6 % (ref 34.0–46.6)
Hemoglobin: 13.7 g/dL (ref 11.1–15.9)
MCH: 25.9 pg — ABNORMAL LOW (ref 26.6–33.0)
MCHC: 32.2 g/dL (ref 31.5–35.7)
MCV: 81 fL (ref 79–97)
Platelets: 286 10*3/uL (ref 150–450)
RBC: 5.29 x10E6/uL — ABNORMAL HIGH (ref 3.77–5.28)
RDW: 13.7 % (ref 11.7–15.4)
WBC: 8.5 10*3/uL (ref 3.4–10.8)

## 2020-02-22 LAB — T4, FREE: Free T4: 1.16 ng/dL (ref 0.82–1.77)

## 2020-02-22 LAB — ANA: Anti Nuclear Antibody (ANA): NEGATIVE

## 2020-03-26 ENCOUNTER — Telehealth: Payer: BC Managed Care – PPO | Admitting: Nurse Practitioner

## 2020-03-26 DIAGNOSIS — L309 Dermatitis, unspecified: Secondary | ICD-10-CM

## 2020-03-26 MED ORDER — PREDNISONE 10 MG PO TABS: 10.0000 mg | ORAL_TABLET | Freq: Every day | ORAL | 0 refills | Status: DC

## 2020-03-26 MED ORDER — DESOXIMETASONE 0.25 % EX CREA: 1.0000 "application " | TOPICAL_CREAM | Freq: Two times a day (BID) | CUTANEOUS | 0 refills | Status: DC

## 2020-03-26 NOTE — Progress Notes (Signed)
E Visit for Rash  We are sorry that you are not feeling well. Here is how we plan to help! Thanks for the picture, that always help.  Based on what you shared with me it looks like you have contact dermatitis.  Contact dermatitis is a skin rash caused by something that touches the skin and causes irritation or inflammation.  Your skin may be red, swollen, dry, cracked, and itch.  The rash should go away in a few days but can last a few weeks.  If you get a rash, it's important to figure out what caused it so the irritant can be avoided in the future. and I have prescribed Prednisone 10 mg daily for 5 days. And topicort to use topically 2x a day. I wonder if it a reaction to necklace. Where it run against the back of your neck    HOME CARE:   Take cool showers and avoid direct sunlight.  Apply cool compress or wet dressings.  Take a bath in an oatmeal bath.  Sprinkle content of one Aveeno packet under running faucet with comfortably warm water.  Bathe for 15-20 minutes, 1-2 times daily.  Pat dry with a towel. Do not rub the rash.  Use hydrocortisone cream.  Take an antihistamine like Benadryl for widespread rashes that itch.  The adult dose of Benadryl is 25-50 mg by mouth 4 times daily.  Caution:  This type of medication may cause sleepiness.  Do not drink alcohol, drive, or operate dangerous machinery while taking antihistamines.  Do not take these medications if you have prostate enlargement.  Read package instructions thoroughly on all medications that you take.  GET HELP RIGHT AWAY IF:   Symptoms don't go away after treatment.  Severe itching that persists.  If you rash spreads or swells.  If you rash begins to smell.  If it blisters and opens or develops a yellow-brown crust.  You develop a fever.  You have a sore throat.  You become short of breath.  MAKE SURE YOU:  Understand these instructions. Will watch your condition. Will get help right away if you are not  doing well or get worse.  Thank you for choosing an e-visit. Your e-visit answers were reviewed by a board certified advanced clinical practitioner to complete your personal care plan. Depending upon the condition, your plan could have included both over the counter or prescription medications. Please review your pharmacy choice. Be sure that the pharmacy you have chosen is open so that you can pick up your prescription now.  If there is a problem you may message your provider in MyChart to have the prescription routed to another pharmacy. Your safety is important to [place]. If you have drug allergies check your prescription carefully.  For the next 24 hours, you can use MyChart to ask questions about today's visit, request a non-urgent call back, or ask for a work or school excuse from your e-visit provider. You will get an email in the next two days asking about your experience. I hope that your e-visit has been valuable and will speed your recovery.    5-10 minutes spent reviewing and documenting in chart.

## 2020-04-23 ENCOUNTER — Other Ambulatory Visit: Payer: Self-pay | Admitting: Nurse Practitioner

## 2020-04-23 MED ORDER — FLUTICASONE PROPIONATE 50 MCG/ACT NA SUSP: 2.0000 | Freq: Every day | NASAL | 5 refills | Status: DC

## 2020-05-22 ENCOUNTER — Ambulatory Visit (INDEPENDENT_AMBULATORY_CARE_PROVIDER_SITE_OTHER): Payer: BC Managed Care – PPO | Admitting: Adult Health

## 2020-05-22 ENCOUNTER — Encounter: Payer: Self-pay | Admitting: Adult Health

## 2020-05-22 VITALS — BP 130/80 | HR 67 | Ht 59.0 in | Wt 182.0 lb

## 2020-05-22 DIAGNOSIS — Z3041 Encounter for surveillance of contraceptive pills: Secondary | ICD-10-CM

## 2020-05-22 NOTE — Progress Notes (Signed)
  Subjective:     Patient ID: Courtney Murray, female   DOB: 27-May-1991, 29 y.o.   MRN: [id number]  HPI Courtney Murray is a 29 year old black female, single, G2 P1011, back in follow up on starting loestrin 1/20 and taking zyrtec. She is having periods, which she wants, no cramping and zyrtec has helped, no rash or itching I takes. PCP is [facility].    Review of Systems Having periods on new OCs No cramping  If takes zyrtec no rash or itching  Reviewed past medical,surgical, social and family history. Reviewed medications and allergies.     Objective:   Physical Exam BP 130/80 (BP Location: Left Arm, Patient Position: Sitting, Cuff Size: Normal)   Pulse 67   Ht 4\' 11"  (1.499 m)   Wt 182 lb (82.6 kg)   LMP 05/07/2020 (Approximate)   BMI 36.76 kg/m  Skin warm and dry. Lungs: clear to ausculation bilaterally. Cardiovascular: regular rate and rhythm.    Assessment:     1. Encounter for surveillance of contraceptive pills Continue Loestrin 1/20 has refills    Plan:     Return in in 5 weeks for physical

## 2020-06-08 IMAGING — US ULTRASOUND LEFT BREAST LIMITED
1 series · 3 of 3 positions shown · non-contrast
Comparison: Breast ultrasound 05/17/2007

CLINICAL DATA: 27-year-old patient has a fluctuating area of
palpable concern and tenderness in the far upper inner left
breast/left chest wall. She had this area evaluated at age 15, and
no suspicious findings or seen. She feels this area best when she is
sitting up, and it is in the 11 o'clock region far superior left
breast/left chest wall.

EXAM:
ULTRASOUND OF THE LEFT BREAST

[Series 1: ultrasound left breast limited · 0.07mm/px · 3 of 3 slices shown]
[im 1/3]
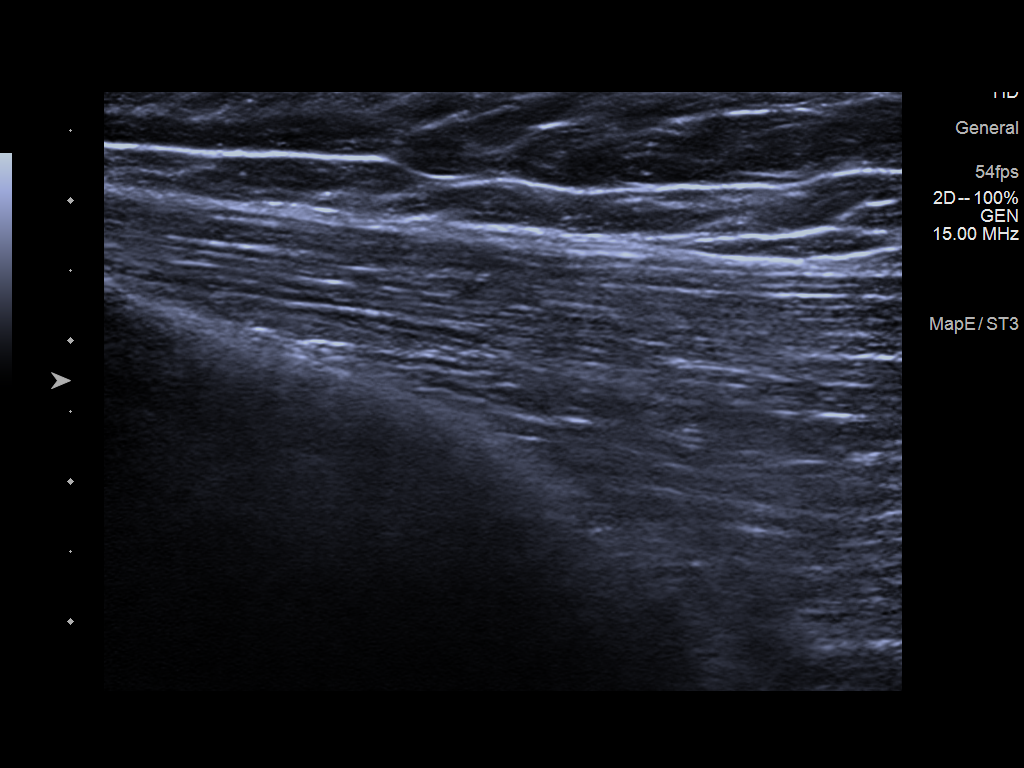
[im 2/3]
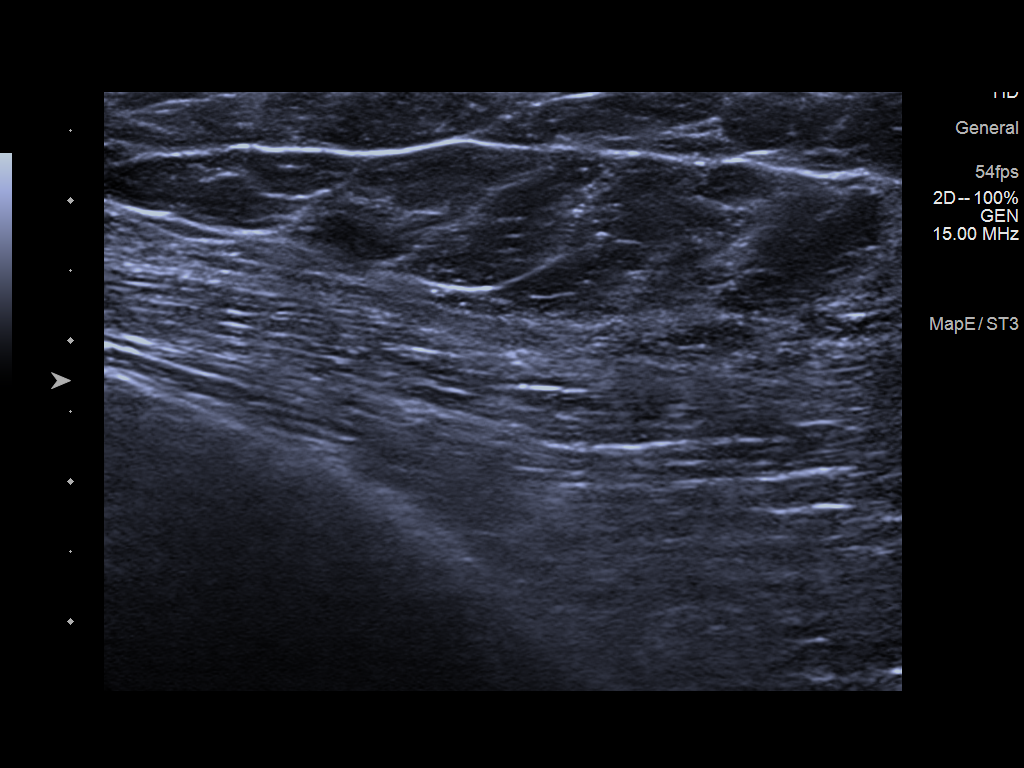
[im 3/3]
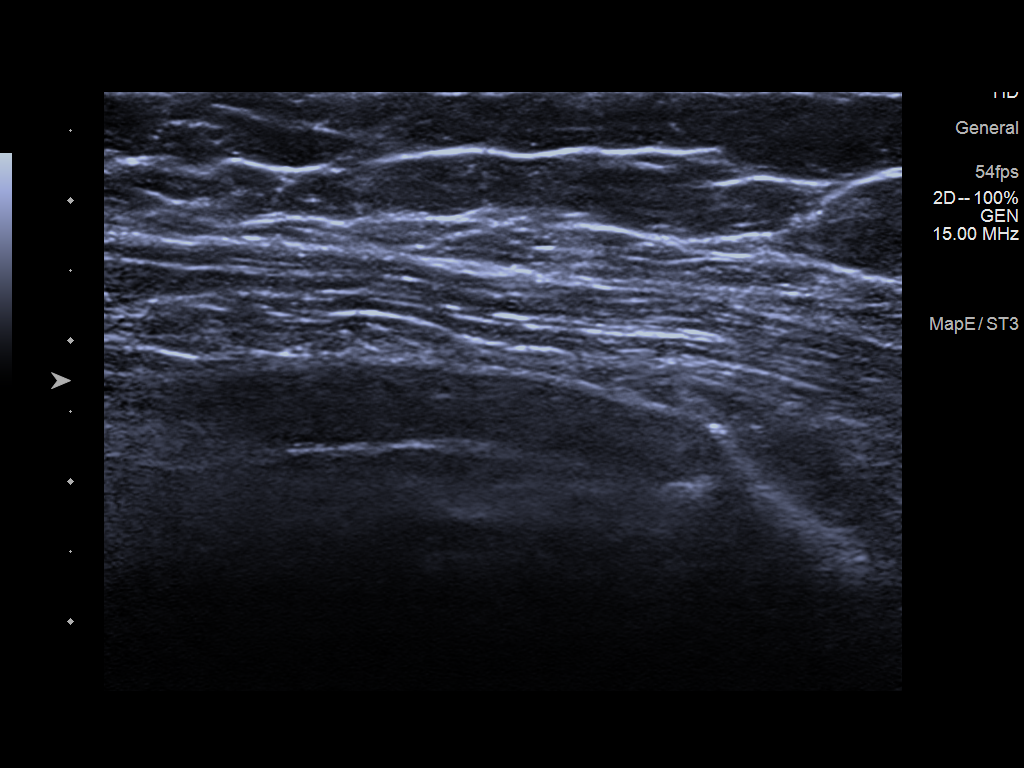

[3 of 3 positions shown; findings below may reference images not displayed]

FINDINGS: On physical exam, I do not palpate a mass or thickening in the far
upper inner left breast/left chest wall in the region of patient
concern

Targeted ultrasound is performed, showing normal skin, subcutaneous
fat, and muscle. No solid or cystic mass or abnormal shadowing is
identified. There is no fluid collection.
IMPRESSION: Negative left breast ultrasound in the region of patient concern.

RECOMMENDATION:
Screening mammogram at age 39 unless there are persistent or
intervening clinical concerns. (Code:8E-C-06A)

I have discussed the findings and recommendations with the patient.
Results were also provided in writing at the conclusion of the
visit. If applicable, a reminder letter will be sent to the patient
regarding the next appointment.

BI-RADS CATEGORY  1: Negative.

## 2020-06-10 ENCOUNTER — Other Ambulatory Visit (INDEPENDENT_AMBULATORY_CARE_PROVIDER_SITE_OTHER): Payer: BC Managed Care – PPO | Admitting: *Deleted

## 2020-06-10 DIAGNOSIS — R350 Frequency of micturition: Secondary | ICD-10-CM

## 2020-06-10 DIAGNOSIS — R309 Painful micturition, unspecified: Secondary | ICD-10-CM

## 2020-06-10 LAB — POCT URINALYSIS DIPSTICK
Glucose, UA: NEGATIVE
Ketones, UA: NEGATIVE
Leukocytes, UA: NEGATIVE
Nitrite, UA: POSITIVE
Protein, UA: NEGATIVE

## 2020-06-10 MED ORDER — SULFAMETHOXAZOLE-TRIMETHOPRIM 800-160 MG PO TABS: 1.0000 | ORAL_TABLET | Freq: Two times a day (BID) | ORAL | 0 refills | Status: DC

## 2020-06-10 NOTE — Addendum Note (Signed)
Addended by: Lazaro Arms on: 06/10/2020 04:13 PM   Modules accepted: Orders

## 2020-06-10 NOTE — Progress Notes (Addendum)
   NURSE VISIT- UTI SYMPTOMS   SUBJECTIVE:  Courtney Murray is a 29 y.o. G59P1011 female here for UTI symptoms. She is a GYN patient. She reports urinary frequency and pain with urination. .  OBJECTIVE:  There were no vitals taken for this visit.  Appears well, in no apparent distress  No results found for this or any previous visit (from the past 24 hour(s)).  ASSESSMENT: GYN patient with UTI symptoms and positive nitrites  PLAN: Note routed to Dr. Despina Hidden   Rx sent by provider today: Yes Urine culture sent Call or return to clinic prn if these symptoms worsen or fail to improve as anticipated. Follow-up: as needed   Stoney Bang  06/10/2020 3:59 PM  Meds ordered this encounter  Medications  . sulfamethoxazole-trimethoprim (BACTRIM DS) 800-160 MG tablet    Sig: Take 1 tablet by mouth 2 (two) times daily.    Dispense:  14 tablet    Refill:  0   Attestation of Attending Supervision of Nursing Visit Encounter: Evaluation and management procedures were performed by the nursing staff under my supervision and collaboration.  I have reviewed the nurse's note and chart, and I agree with the management and plan.  Rockne Coons MD Attending Physician for the [facility] 06/10/2020 4:13 PM

## 2020-06-12 LAB — URINE CULTURE

## 2020-06-26 ENCOUNTER — Encounter: Payer: Self-pay | Admitting: Adult Health

## 2020-06-26 ENCOUNTER — Ambulatory Visit (INDEPENDENT_AMBULATORY_CARE_PROVIDER_SITE_OTHER): Payer: BC Managed Care – PPO | Admitting: Adult Health

## 2020-06-26 VITALS — BP 107/70 | HR 73 | Ht 59.0 in | Wt 177.0 lb

## 2020-06-26 DIAGNOSIS — Z3041 Encounter for surveillance of contraceptive pills: Secondary | ICD-10-CM | POA: Diagnosis not present

## 2020-06-26 DIAGNOSIS — Z01419 Encounter for gynecological examination (general) (routine) without abnormal findings: Secondary | ICD-10-CM | POA: Diagnosis not present

## 2020-06-26 MED ORDER — NORETHINDRONE ACET-ETHINYL EST 1-20 MG-MCG PO TABS: 1.0000 | ORAL_TABLET | Freq: Every day | ORAL | 12 refills | Status: DC

## 2020-06-26 NOTE — Progress Notes (Signed)
Patient ID: TYKIERA RAVEN, female   DOB: August 09, 1991, 29 y.o. y.o.   MRN: [id number] History of Present Illness: Ozie is a 29 year old black female, single, G2P1011, in for a well woman gyn exam, she had a normal pap 05/24/19.  PCP is [facility].  Current Medications, Allergies, Past Medical History, Past Surgical History, Family History and Social History were reviewed in [employer] record.     Review of Systems:  Patient denies any headaches, hearing loss, fatigue, blurred vision, shortness of breath, chest pain, abdominal pain, problems with bowel movements, urination, or intercourse. No joint pain or mood swings. Doing good with these OCs   Physical Exam:BP 107/70 (BP Location: Left Arm, Patient Position: Sitting, Cuff Size: Normal)   Pulse 73   Ht 4\' 11"  (1.499 m)   Wt 177 lb (80.3 kg)   BMI 35.75 kg/m  General:  Well developed, well nourished, no acute distress Skin:  Warm and dry Neck:  Midline trachea, normal thyroid, good ROM, no lymphadenopathy Lungs; Clear to auscultation bilaterally Breast:  No dominant palpable mass, retraction, or nipple discharge Cardiovascular: Regular rate and rhythm Abdomen:  Soft, non tender, no hepatosplenomegaly Pelvic:  External genitalia is normal in appearance, no lesions.  The vagina is normal in appearance. Urethra has no lesions or masses. The cervix is bulbous.  Uterus is felt to be normal size, shape, and contour.  No adnexal masses or tenderness noted.Bladder is non tender, no masses felt.. Extremities/musculoskeletal:  No swelling or varicosities noted, no clubbing or cyanosis Psych:  No mood changes, alert and cooperative,seems happy AA is 1 Fall risk is low PHQ 9 score is 0  [facility] - 06/26/20 1018      Pregnancy Intention Screening   Does the patient want to become pregnant in the next year? No    Does the patient's partner want to become pregnant in the next year? No    Would the patient like to discuss  contraceptive options today? No      Contraception Wrap Up   Current Method Oral Contraceptive    End Method Oral Contraceptive    Contraception Counseling Provided No         Examination chaperoned by 08/26/20 RN   Impression and Plan: 1. Encounter for well woman exam with routine gynecological exam Physical in 1 year  Pap in 2023 Labs with PCP   2. Encounter for surveillance of contraceptive pills Continue OCs Meds ordered this encounter  Medications  . norethindrone-ethinyl estradiol (LOESTRIN) 1-20 MG-MCG tablet    Sig: Take 1 tablet by mouth daily.    Dispense:  28 tablet    Refill:  12    Order Specific Question:   Supervising Provider    Answer:   2024 H [[id number]]

## 2020-08-21 ENCOUNTER — Other Ambulatory Visit: Payer: Self-pay

## 2020-08-21 ENCOUNTER — Ambulatory Visit
Admission: EM | Admit: 2020-08-21 | Discharge: 2020-08-21 | Disposition: A | Discharge status: Home / Self Care | Payer: BC Managed Care – PPO | Attending: Emergency Medicine | Admitting: Emergency Medicine

## 2020-08-21 DIAGNOSIS — R0789 Other chest pain: Secondary | ICD-10-CM | POA: Diagnosis not present

## 2020-08-21 DIAGNOSIS — S29011A Strain of muscle and tendon of front wall of thorax, initial encounter: Secondary | ICD-10-CM

## 2020-08-21 MED ORDER — NAPROXEN 500 MG PO TABS: 500.0000 mg | ORAL_TABLET | Freq: Two times a day (BID) | ORAL | 0 refills | Status: DC

## 2020-08-21 MED ORDER — CYCLOBENZAPRINE HCL 10 MG PO TABS: 10.0000 mg | ORAL_TABLET | Freq: Every day | ORAL | 0 refills | Status: DC

## 2020-08-21 NOTE — ED Triage Notes (Signed)
Pt presents with complaints of a pain in the top of her left side of her chest. Reports that when she moves or turns she can feel it pulling. Pain has been present x 2 days. Denies any precipitating factors. Pt drives a school bus for work. Denies any other symptoms.

## 2020-08-21 NOTE — Discharge Instructions (Signed)
[place] ordered.  [place] imagining will be in contact with you Continue conservative management of rest, ice, and gentle stretches Take naproxen as needed for pain relief (may cause abdominal discomfort, ulcers, and GI bleeds avoid taking with other NSAIDs) Take cyclobenzaprine at nighttime for symptomatic relief. Avoid driving or operating heavy machinery while using medication. Follow up with PCP if symptoms persist Return or go to the ER if you have any new or worsening symptoms (fever, chills, chest pain, shortness of breath, breast changes, etc...)

## 2020-08-21 NOTE — ED Provider Notes (Signed)
[facility]   [id number] 08/21/20 Arrival Time: 3354  CC: Chest wall pain  SUBJECTIVE: History from: patient. Courtney Murray is a 29 y.o. female complains of LT sided chest wall pain x 2 day.  Denies a precipitating event or specific injury.  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  Localizes the pain to the LT upper chest.  Describes the pain as constant, dull,  and achy in character.  Has NOT tried OTC medications.  Symptoms are made worse with movement.  Reports lump in upper chest wall when she was 15.  Had [place] and told it was hormone changes.  Had a recent US 6 months ago that was negative as well.  Denies fever, chills, SOB, erythema, ecchymosis, effusion, weakness, numbness and tingling, breast mass, lumps, asymmetry, nipple discharge, nipple inversions, axillary LAD.  ROS: As per HPI.  All other pertinent ROS negative.     Past Medical History:  Diagnosis Date  . Chlamydia   . Contraceptive management 05/09/2014  . Gestational diabetes   . Gestational diabetes mellitus, antepartum   . Gonorrhea in female   . Nexplanon in place 05/19/2016  . Pregnant 11/25/2014  . Vaginal itching 08/27/2014  . Yeast infection of the vagina 08/27/2014   Past Surgical History:  Procedure Laterality Date  . NO PAST SURGERIES     No Known Allergies No current facility-administered medications on file prior to encounter.   Current Outpatient Medications on File Prior to Encounter  Medication Sig Dispense Refill  . norethindrone-ethinyl estradiol (LOESTRIN) 1-20 MG-MCG tablet Take 1 tablet by mouth daily. 28 tablet 12  . [DISCONTINUED] cetirizine (ZYRTEC ALLERGY) 10 MG tablet Take 1 tablet (10 mg total) by mouth daily. (Patient not taking: Reported on 06/26/2020)    . [DISCONTINUED] fluticasone (FLONASE) 50 MCG/ACT nasal spray Place 2 sprays into both nostrils daily. (Patient not taking: Reported on 06/26/2020) 16 g 5   Social History   Socioeconomic History  . Marital status: Single     Spouse name: Not on file  . Number of children: 1  . Years of education: Not on file  . Highest education level: Not on file  Occupational History  . Not on file  Tobacco Use  . Smoking status: Current Every Day Smoker    Types: Cigars  . Smokeless tobacco: Never Used  Vaping Use  . Vaping Use: Never used  Substance and Sexual Activity  . Alcohol use: No  . Drug use: No  . Sexual activity: Yes    Birth control/protection: Pill  Other Topics Concern  . Not on file  Social History Narrative  . Not on file   Social Determinants of Health   Financial Resource Strain: Low Risk   . Difficulty of Paying Living Expenses: Not very hard  Food Insecurity: No Food Insecurity  . Worried About Programme researcher, broadcasting/film/video in the Last Year: Never true  . Ran Out of Food in the Last Year: Never true  Transportation Needs: No Transportation Needs  . Lack of Transportation (Medical): No  . Lack of Transportation (Non-Medical): No  Physical Activity: Inactive  . Days of Exercise per Week: 0 days  . Minutes of Exercise per Session: 0 min  Stress: No Stress Concern Present  . Feeling of Stress : Not at all  Social Connections: Moderately Integrated  . Frequency of Communication with Friends and Family: More than three times a week  . Frequency of Social Gatherings with Friends and Family: More than  three times a week  . Attends Religious Services: More than 4 times per year  . Active Member of Clubs or Organizations: Yes  . Attends Banker Meetings: Never  . Marital Status: Never married  Intimate Partner Violence: Not At Risk  . Fear of Current or Ex-Partner: No  . Emotionally Abused: No  . Physically Abused: No  . Sexually Abused: No   Family History  Problem Relation Age of Onset  . Stroke Paternal Grandmother   . Heart disease Paternal Grandmother   . Diabetes Maternal Grandmother        borderline  . Thyroid disease Maternal Grandmother   . Stroke Maternal Grandmother     . Other Maternal Grandmother        blood clots  . Sleep apnea Father   . Stroke Mother   . Diabetes Mother   . Sleep apnea Brother     OBJECTIVE:  Vitals:   08/21/20 0844  BP: 123/80  Pulse: 68  Resp: 16  Temp: 98.4 F (36.9 C)  TempSrc: Oral  SpO2: 95%    General appearance: ALERT; in no acute distress.  Head: NCAT Lungs: Normal respiratory effort; CTAB CV: RRR Musculoskeletal: Chest wall Inspection: Skin warm, dry, clear and intact without obvious erythema, effusion, or ecchymosis.  Palpation: TTP over LT upper chest in 12-3 o'clock position, possible spasm apx 2-3 cm in diameter, round, moveable ROM: FROM active and passive Strength: 5/5 shld abduction, 5/5 shld adduction, 5/5 pectoralis flexion Skin: warm and dry Neurologic: Ambulates without difficulty Psychological: alert and cooperative; normal mood and affect  ASSESSMENT & PLAN:  1. Chest wall pain   2. Pectoralis muscle strain, initial encounter     Meds ordered this encounter  Medications  . naproxen (NAPROSYN) 500 MG tablet    Sig: Take 1 tablet (500 mg total) by mouth 2 (two) times daily.    Dispense:  30 tablet    Refill:  0    Order Specific Question:   Supervising Provider    Answer:   Eustace Moore [[id number]]  . cyclobenzaprine (FLEXERIL) 10 MG tablet    Sig: Take 1 tablet (10 mg total) by mouth at bedtime.    Dispense:  15 tablet    Refill:  0    Order Specific Question:   Supervising Provider    Answer:   Eustace Moore [[id number]]   [place] ordered.  [place] imagining will be in contact with you Continue conservative management of rest, ice, and gentle stretches Take naproxen as needed for pain relief (may cause abdominal discomfort, ulcers, and GI bleeds avoid taking with other NSAIDs) Take cyclobenzaprine at nighttime for symptomatic relief. Avoid driving or operating heavy machinery while using medication. Follow up with PCP if symptoms persist Return or go to the ER if you have  any new or worsening symptoms (fever, chills, chest pain, shortness of breath, breast changes, etc...)    Reviewed expectations re: course of current medical issues. Questions answered. Outlined signs and symptoms indicating need for more acute intervention. Patient verbalized understanding. After Visit Summary given.    Rennis Harding, PA-C 08/21/20 [phone]

## 2020-08-26 ENCOUNTER — Other Ambulatory Visit: Payer: Self-pay | Admitting: Emergency Medicine

## 2020-08-26 DIAGNOSIS — N644 Mastodynia: Secondary | ICD-10-CM

## 2020-09-08 ENCOUNTER — Ambulatory Visit
Admission: RE | Admit: 2020-09-08 | Discharge: 2020-09-08 | Disposition: A | Discharge status: Home / Self Care | Payer: BC Managed Care – PPO | Source: Ambulatory Visit | Attending: Emergency Medicine | Admitting: Emergency Medicine

## 2020-09-08 ENCOUNTER — Other Ambulatory Visit: Payer: Self-pay

## 2020-09-08 DIAGNOSIS — N644 Mastodynia: Secondary | ICD-10-CM

## 2020-10-22 ENCOUNTER — Other Ambulatory Visit: Payer: Self-pay | Admitting: Adult Health

## 2020-12-08 ENCOUNTER — Other Ambulatory Visit: Payer: Self-pay

## 2020-12-08 ENCOUNTER — Ambulatory Visit
Admission: RE | Admit: 2020-12-08 | Discharge: 2020-12-08 | Disposition: A | Discharge status: Home / Self Care | Payer: BC Managed Care – PPO | Source: Ambulatory Visit | Attending: Emergency Medicine | Admitting: Emergency Medicine

## 2020-12-08 VITALS — BP 120/79 | HR 64 | Temp 98.0°F | Resp 15 | Ht 59.0 in | Wt 170.0 lb

## 2020-12-08 DIAGNOSIS — Z1152 Encounter for screening for COVID-19: Secondary | ICD-10-CM | POA: Diagnosis not present

## 2020-12-08 DIAGNOSIS — B9789 Other viral agents as the cause of diseases classified elsewhere: Secondary | ICD-10-CM | POA: Diagnosis not present

## 2020-12-08 DIAGNOSIS — J069 Acute upper respiratory infection, unspecified: Secondary | ICD-10-CM

## 2020-12-08 DIAGNOSIS — J329 Chronic sinusitis, unspecified: Secondary | ICD-10-CM

## 2020-12-08 MED ORDER — BENZONATATE 100 MG PO CAPS: 100.0000 mg | ORAL_CAPSULE | Freq: Three times a day (TID) | ORAL | 0 refills | Status: DC

## 2020-12-08 MED ORDER — CETIRIZINE HCL 10 MG PO TABS: 10.0000 mg | ORAL_TABLET | Freq: Every day | ORAL | 0 refills | Status: DC

## 2020-12-08 MED ORDER — PREDNISONE 10 MG PO TABS: 20.0000 mg | ORAL_TABLET | Freq: Every day | ORAL | 0 refills | Status: AC

## 2020-12-08 MED ORDER — PREDNISONE 10 MG PO TABS: 20.0000 mg | ORAL_TABLET | Freq: Every day | ORAL | 0 refills | Status: DC

## 2020-12-08 MED ORDER — FLUTICASONE PROPIONATE 50 MCG/ACT NA SUSP: 1.0000 | Freq: Every day | NASAL | 0 refills | Status: DC

## 2020-12-08 NOTE — ED Provider Notes (Signed)
[facility]   [id number] 12/08/20 Arrival Time: 1101   CC: COVID symptoms  SUBJECTIVE: History from: patient and family.  Courtney Murray is a 30 y.o. female who presented to the urgent care with a complaint of headache, cough, runny nose for the past few days.  States he has negative home COVID-19 test..  Denies sick exposure to COVID, flu or strep.  Denies recent travel.  Has tried OTC medication without relief.  Denies alleviating or aggravating factors.  Denies previous symptoms in the past.   Denies fever, chills, fatigue, sinus pain, rhinorrhea, sore throat, SOB, wheezing, chest pain, nausea, changes in bowel or bladder habits.    ROS: As per HPI.  All other pertinent ROS negative.      Past Medical History:  Diagnosis Date  . Chlamydia   . Contraceptive management 05/09/2014  . Gestational diabetes   . Gestational diabetes mellitus, antepartum   . Gonorrhea in female   . Nexplanon in place 05/19/2016  . Pregnant 11/25/2014  . Vaginal itching 08/27/2014  . Yeast infection of the vagina 08/27/2014   Past Surgical History:  Procedure Laterality Date  . NO PAST SURGERIES     No Known Allergies No current facility-administered medications on file prior to encounter.   Current Outpatient Medications on File Prior to Encounter  Medication Sig Dispense Refill  . cyclobenzaprine (FLEXERIL) 10 MG tablet Take 1 tablet (10 mg total) by mouth at bedtime. 15 tablet 0  . naproxen (NAPROSYN) 500 MG tablet Take 1 tablet (500 mg total) by mouth 2 (two) times daily. 30 tablet 0  . norethindrone-ethinyl estradiol (LOESTRIN) 1-20 MG-MCG tablet TAKE 1 TABLET BY MOUTH DAILY 21 tablet 10   Social History   Socioeconomic History  . Marital status: Single    Spouse name: Not on file  . Number of children: 1  . Years of education: Not on file  . Highest education level: Not on file  Occupational History  . Not on file  Tobacco Use  . Smoking status: Current Every Day Smoker     Types: Cigars  . Smokeless tobacco: Never Used  Vaping Use  . Vaping Use: Never used  Substance and Sexual Activity  . Alcohol use: No  . Drug use: No  . Sexual activity: Yes    Birth control/protection: Pill  Other Topics Concern  . Not on file  Social History Narrative  . Not on file   Social Determinants of Health   Financial Resource Strain: Low Risk   . Difficulty of Paying Living Expenses: Not very hard  Food Insecurity: No Food Insecurity  . Worried About Programme researcher, broadcasting/film/video in the Last Year: Never true  . Ran Out of Food in the Last Year: Never true  Transportation Needs: No Transportation Needs  . Lack of Transportation (Medical): No  . Lack of Transportation (Non-Medical): No  Physical Activity: Inactive  . Days of Exercise per Week: 0 days  . Minutes of Exercise per Session: 0 min  Stress: No Stress Concern Present  . Feeling of Stress : Not at all  Social Connections: Moderately Integrated  . Frequency of Communication with Friends and Family: More than three times a week  . Frequency of Social Gatherings with Friends and Family: More than three times a week  . Attends Religious Services: More than 4 times per year  . Active Member of Clubs or Organizations: Yes  . Attends Banker Meetings: Never  . Marital Status: Never  married  Intimate Partner Violence: Not At Risk  . Fear of Current or Ex-Partner: No  . Emotionally Abused: No  . Physically Abused: No  . Sexually Abused: No   Family History  Problem Relation Age of Onset  . Stroke Paternal Grandmother   . Heart disease Paternal Grandmother   . Diabetes Maternal Grandmother        borderline  . Thyroid disease Maternal Grandmother   . Stroke Maternal Grandmother   . Other Maternal Grandmother        blood clots  . Sleep apnea Father   . Stroke Mother   . Diabetes Mother   . Sleep apnea Brother     OBJECTIVE:  Vitals:   12/08/20 1132 12/08/20 1136  BP: 120/79   Pulse: 64    Resp: 15   Temp: 98 F (36.7 C)   SpO2: 98%   Weight:  170 lb (77.1 kg)  Height:  4\' 11"  (1.499 m)     General appearance: alert; appears fatigued, but nontoxic; speaking in full sentences and tolerating own secretions HEENT: NCAT; Ears: EACs clear, TMs pearly gray; Eyes: PERRL.  EOM grossly intact. Sinuses: Maxillary sinus tender; Nose: nares patent without rhinorrhea, Throat: oropharynx clear, tonsils non erythematous or enlarged, uvula midline  Neck: supple without LAD Lungs: unlabored respirations, symmetrical air entry; cough: moderate; no respiratory distress; CTAB Heart: regular rate and rhythm.  Radial pulses 2+ symmetrical bilaterally Skin: warm and dry Psychological: alert and cooperative; normal mood and affect  LABS:  No results found for this or any previous visit (from the past 24 hour(s)).   ASSESSMENT & PLAN:  1. Encounter for screening for COVID-19   2. Viral sinusitis   3. URI with cough and congestion     Meds ordered this encounter  Medications  . benzonatate (TESSALON) 100 MG capsule    Sig: Take 1 capsule (100 mg total) by mouth every 8 (eight) hours.    Dispense:  21 capsule    Refill:  0  . fluticasone (FLONASE) 50 MCG/ACT nasal spray    Sig: Place 1 spray into both nostrils daily for 14 days.    Dispense:  16 g    Refill:  0  . cetirizine (ZYRTEC ALLERGY) 10 MG tablet    Sig: Take 1 tablet (10 mg total) by mouth daily.    Dispense:  30 tablet    Refill:  0  . DISCONTD: predniSONE (DELTASONE) 10 MG tablet    Sig: Take 2 tablets (20 mg total) by mouth daily.    Dispense:  15 tablet    Refill:  0  . predniSONE (DELTASONE) 10 MG tablet    Sig: Take 2 tablets (20 mg total) by mouth daily for 7 days.    Dispense:  14 tablet    Refill:  0    Discharge Instructions  She may return to work pending negative COVID-19 test result  Reviewed expectations re: course of current medical issues. Questions answered. Outlined signs and symptoms  indicating need for more acute intervention. Patient verbalized understanding. After Visit Summary given.         , FNP 12/08/20 1240

## 2020-12-08 NOTE — ED Triage Notes (Signed)
Headache, cough, runny nose, teeth hurting.  Took home covid test that was negative

## 2020-12-08 NOTE — Discharge Instructions (Signed)
COVID testing ordered.  It will take between 2-7 days for test results.  Someone will contact you regarding abnormal results.    Get plenty of rest and push fluids Tessalon Perles prescribed for cough Zyrtec for nasal congestion, runny nose, and/or sore throat Flonase for nasal congestion and runny nose As was prescribed Use medications daily for symptom relief Use OTC medications like ibuprofen or tylenol as needed fever or pain Call or go to the ED if you have any new or worsening symptoms such as fever, worsening cough, shortness of breath, chest tightness, chest pain, turning blue, changes in mental status, etc..Marland Kitchen

## 2020-12-09 ENCOUNTER — Other Ambulatory Visit: Payer: BC Managed Care – PPO

## 2020-12-10 LAB — COVID-19, FLU A+B NAA
Influenza A, NAA: NOT DETECTED
Influenza B, NAA: NOT DETECTED
SARS-CoV-2, NAA: NOT DETECTED

## 2021-08-10 ENCOUNTER — Encounter: Payer: Self-pay | Admitting: Family Medicine

## 2021-09-17 ENCOUNTER — Encounter (HOSPITAL_COMMUNITY): Payer: Self-pay | Admitting: *Deleted

## 2021-09-17 ENCOUNTER — Emergency Department (HOSPITAL_COMMUNITY)
Admission: EM | Admit: 2021-09-17 | Discharge: 2021-09-17 | Disposition: A | Discharge status: Home / Self Care | Payer: BC Managed Care – PPO | Attending: Emergency Medicine | Admitting: Emergency Medicine

## 2021-09-17 DIAGNOSIS — F17290 Nicotine dependence, other tobacco product, uncomplicated: Secondary | ICD-10-CM | POA: Diagnosis not present

## 2021-09-17 DIAGNOSIS — J3489 Other specified disorders of nose and nasal sinuses: Secondary | ICD-10-CM | POA: Diagnosis not present

## 2021-09-17 DIAGNOSIS — J111 Influenza due to unidentified influenza virus with other respiratory manifestations: Secondary | ICD-10-CM | POA: Diagnosis not present

## 2021-09-17 DIAGNOSIS — R059 Cough, unspecified: Secondary | ICD-10-CM | POA: Diagnosis present

## 2021-09-17 MED ORDER — BENZONATATE 100 MG PO CAPS: 100.0000 mg | ORAL_CAPSULE | Freq: Three times a day (TID) | ORAL | 0 refills | Status: DC

## 2021-09-17 MED ORDER — OSELTAMIVIR PHOSPHATE 75 MG PO CAPS: 75.0000 mg | ORAL_CAPSULE | Freq: Two times a day (BID) | ORAL | 0 refills | Status: DC

## 2021-09-17 NOTE — Discharge Instructions (Signed)
You likely have the flu, you will need to stay out of work for several days as you will be contagious to others.  Take Tamiflu twice a day for the next 5 days, return to the ER for severe or worsening symptoms

## 2021-09-17 NOTE — ED Triage Notes (Signed)
Fever, cough, chills onset today. 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

## 2021-09-17 NOTE — ED Provider Notes (Signed)
[facility] EMERGENCY DEPARTMENT Provider Note   CSN: [id number] Arrival date & time: 09/17/21  1701     History Chief Complaint  Patient presents with   Influenza    Courtney Murray is a 30 y.o. female.   Influenza Presenting symptoms: cough and myalgias   Presenting symptoms: no fever    This patient is a 30 year old female, she has a history of taking care of her child who is positive for the flu this last week, she is now here with a combination of fevers, chills, coughing, sore throat, body aches.  She has had no medication prior to arrival.  Symptoms are persistent, gradually worsening, not associated with nausea vomiting or diarrhea.  She had to leave work today because of the illness  Past Medical History:  Diagnosis Date   Chlamydia    Contraceptive management 05/09/2014   Gestational diabetes    Gestational diabetes mellitus, antepartum    Gonorrhea in female    Nexplanon in place 05/19/2016   Pregnant 11/25/2014   Vaginal itching 08/27/2014   Yeast infection of the vagina 08/27/2014    Patient Active Problem List   Diagnosis Date Noted   Missed periods 02/21/2020   Hives 02/21/2020   Weight gain 02/21/2020   Encounter for gynecological examination with Papanicolaou smear of cervix 05/24/2019   Mass of upper inner quadrant of left breast 05/24/2019   Bilateral low back pain 05/09/2019   Pregnancy examination or test, negative result 05/09/2019   Pelvic pain 05/09/2019   Encounter for surveillance of contraceptive pills 11/16/2018   Irregular intermenstrual bleeding 11/16/2018   Sore throat 11/16/2018   Encounter for initial prescription of contraceptive pills 08/16/2018   Encounter for Nexplanon removal 08/16/2018   Rectal itching 10/11/2017   Nexplanon in place 05/19/2016   History of gestational diabetes 04/21/2016   Encounter for well woman exam with routine gynecological exam 09/23/2015   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    Past Surgical History:   Procedure Laterality Date   NO PAST SURGERIES       OB History     Gravida  2   Para  1   Term  1   Preterm      AB  1   Living  1      SAB      IAB  1   Ectopic      Multiple  0   Live Births  1           Family History  Problem Relation Age of Onset   Stroke Paternal Grandmother    Heart disease Paternal Grandmother    Diabetes Maternal Grandmother        borderline   Thyroid disease Maternal Grandmother    Stroke Maternal Grandmother    Other Maternal Grandmother        blood clots   Sleep apnea Father    Stroke Mother    Diabetes Mother    Sleep apnea Brother     Social History   Tobacco Use   Smoking status: Every Day    Types: Cigars   Smokeless tobacco: Never  Vaping Use   Vaping Use: Never used  Substance Use Topics   Alcohol use: No   Drug use: No    Home Medications Prior to Admission medications   Medication Sig Start Date End Date Taking? Authorizing Provider  benzonatate (TESSALON) 100 MG capsule Take 1 capsule (100 mg total) by mouth every 8 (eight) hours. 09/17/21  Yes Eber Hong, MD  oseltamivir (TAMIFLU) 75 MG capsule Take 1 capsule (75 mg total) by mouth every 12 (twelve) hours. 09/17/21  Yes Eber Hong, MD  cetirizine (ZYRTEC ALLERGY) 10 MG tablet Take 1 tablet (10 mg total) by mouth daily. 12/08/20   Avegno, Zachery Dakins, FNP  fluticasone (FLONASE) 50 MCG/ACT nasal spray Place 1 spray into both nostrils daily for 14 days. 12/08/20 12/22/20  Avegno, Zachery Dakins, FNP  naproxen (NAPROSYN) 500 MG tablet Take 1 tablet (500 mg total) by mouth 2 (two) times daily. 08/21/20   Wurst, [place], PA-C  norethindrone-ethinyl estradiol (LOESTRIN) 1-20 MG-MCG tablet TAKE 1 TABLET BY MOUTH DAILY 10/22/20   Adline Potter, NP    Allergies    Patient has no known allergies.  Review of Systems   Review of Systems  Constitutional:  Negative for fever.  Respiratory:  Positive for cough.   Musculoskeletal:  Positive for myalgias.    Physical Exam Updated Vital Signs BP 134/69 (BP Location: Right Arm)   Pulse 84   Temp 99.6 F (37.6 C) (Oral)   Resp 17   SpO2 98%   Physical Exam Vitals and nursing note reviewed.  Constitutional:      Appearance: She is well-developed. She is not diaphoretic.  HENT:     Head: Normocephalic and atraumatic.     Nose: Rhinorrhea present.     Mouth/Throat:     Pharynx: Posterior oropharyngeal erythema present.  Eyes:     General:        Right eye: No discharge.        Left eye: No discharge.     Conjunctiva/sclera: Conjunctivae normal.  Cardiovascular:     Rate and Rhythm: Normal rate.  Pulmonary:     Effort: Pulmonary effort is normal. No respiratory distress.     Breath sounds: No wheezing or rales.  Skin:    General: Skin is warm and dry.     Findings: No erythema or rash.  Neurological:     Mental Status: She is alert.     Coordination: Coordination normal.    ED Results / Procedures / Treatments   Labs (all labs ordered are listed, but only abnormal results are displayed) Labs Reviewed - No data to display  EKG None  Radiology No results found.  Procedures Procedures   Medications Ordered in ED Medications - No data to display  ED Course  I have reviewed the triage vital signs and the nursing notes.  Pertinent labs & imaging results that were available during my care of the patient were reviewed by me and considered in my medical decision making (see chart for details).    MDM Rules/Calculators/A&P                           No signs of peritonsillar abscess, signs and symptoms consistent with influenza.  She is not tachycardic hypoxic or have any abnormal lung sounds.  We will treat with Tamiflu and an antitussive.  Patient is agreeable to the plan.  Stable for discharge  Final Clinical Impression(s) / ED Diagnoses Final diagnoses:  Influenza    Rx / DC Orders ED Discharge Orders          Ordered    oseltamivir (TAMIFLU) 75 MG capsule   Every 12 hours        09/17/21 1726    benzonatate (TESSALON) 100 MG capsule  Every 8 hours  09/17/21 1726             Eber Hong, MD 09/17/21 1730

## 2021-09-29 ENCOUNTER — Ambulatory Visit: Payer: Medicaid Other | Admitting: Family Medicine

## 2021-12-22 ENCOUNTER — Ambulatory Visit: Payer: BC Managed Care – PPO | Admitting: Adult Health

## 2022-02-11 ENCOUNTER — Ambulatory Visit (INDEPENDENT_AMBULATORY_CARE_PROVIDER_SITE_OTHER): Payer: BC Managed Care – PPO | Admitting: Nurse Practitioner

## 2022-02-11 ENCOUNTER — Encounter: Payer: Self-pay | Admitting: Nurse Practitioner

## 2022-02-11 VITALS — BP 123/74 | HR 70 | Temp 98.4°F | Ht 59.0 in | Wt 174.0 lb

## 2022-02-11 DIAGNOSIS — H65111 Acute and subacute allergic otitis media (mucoid) (sanguinous) (serous), right ear: Secondary | ICD-10-CM

## 2022-02-11 MED ORDER — CETIRIZINE HCL 10 MG PO TABS: 10.0000 mg | ORAL_TABLET | Freq: Every day | ORAL | 3 refills | Status: DC

## 2022-02-11 MED ORDER — LORATADINE 10 MG PO TABS: 10.0000 mg | ORAL_TABLET | Freq: Every day | ORAL | 3 refills | Status: DC

## 2022-02-11 NOTE — Progress Notes (Signed)
?  Subjective:  ? ? Patient ID: Courtney Murray, female    DOB: 07/11/1991, 31 y.o.   MRN: [id number] ? ?HPI ? ?31 year old female patient with medical history seasonal allergies presents to the clinic today with bilateral ear pain x2 days.  Patient states that her right ear feels worse.  Patient also states that her ears feel muffled.  Patient denies sore throat, runny nose, body aches, fever, chills, shortness of breath, wheezing.   ? ? ?Review of Systems  ?HENT:  Positive for ear pain.   ? ?   ?Objective:  ? Physical Exam ?HENT:  ?   Head: Normocephalic.  ?   Right Ear: Hearing, ear canal and external ear normal. No decreased hearing noted. No laceration, drainage, swelling or tenderness. A middle ear effusion is present. There is no impacted cerumen. No foreign body. No mastoid tenderness. No PE tube. No hemotympanum. Tympanic membrane is injected. Tympanic membrane is not scarred, perforated, erythematous, retracted or bulging.  ?   Left Ear: Hearing, ear canal and external ear normal. No decreased hearing noted. No laceration, drainage, swelling or tenderness.  No middle ear effusion. There is no impacted cerumen. No foreign body. No mastoid tenderness. No PE tube. No hemotympanum. Tympanic membrane is injected. Tympanic membrane is not scarred, perforated, erythematous, retracted or bulging.  ?Cardiovascular:  ?   Rate and Rhythm: Normal rate and regular rhythm.  ?   Pulses: Normal pulses.  ?   Heart sounds: Normal heart sounds. No murmur heard. ?Pulmonary:  ?   Effort: Pulmonary effort is normal. No respiratory distress.  ?   Breath sounds: Normal breath sounds. No wheezing.  ?Musculoskeletal:  ?   Cervical back: Normal range of motion and neck supple. No rigidity or tenderness.  ?   Comments: Grossly intact  ?Lymphadenopathy:  ?   Cervical: No cervical adenopathy.  ?Skin: ?   General: Skin is warm.  ?   Capillary Refill: Capillary refill takes less than 2 seconds.  ?Neurological:  ?   Comments: Grossly intact   ?Psychiatric:     ?   Mood and Affect: Mood normal.     ?   Behavior: Behavior normal.  ? ? ?   ?Assessment & Plan:  ? ?1. Non-recurrent acute allergic otitis media of right ear ?-Likely allergy related. ?-Do not believe it is currently bacterial related due to lack of systemic symptoms and presentation and physical exam. ?-Patient encouraged to use Nettie pot over-the-counter for symptom relief ?-Patient also encouraged to take allergy medicine for symptom relief. ?- loratadine (CLARITIN) 10 MG tablet; Take 1 tablet (10 mg total) by mouth daily.  Dispense: 30 tablet; Refill: 3 ?- cetirizine (ZYRTEC ALLERGY) 10 MG tablet; Take 1 tablet (10 mg total) by mouth daily.  Dispense: 30 tablet; Refill: 3 ?Return to clinic if symptoms are not better or worsen. ? ?  ?Note:  This document was prepared using Dragon voice recognition software and may include unintentional dictation errors. ? ? ?

## 2022-03-26 ENCOUNTER — Encounter: Payer: Self-pay | Admitting: Adult Health

## 2022-03-26 ENCOUNTER — Ambulatory Visit (INDEPENDENT_AMBULATORY_CARE_PROVIDER_SITE_OTHER): Payer: BC Managed Care – PPO | Admitting: Adult Health

## 2022-03-26 ENCOUNTER — Other Ambulatory Visit (HOSPITAL_COMMUNITY)
Admission: RE | Admit: 2022-03-26 | Discharge: 2022-03-26 | Disposition: A | Discharge status: Home / Self Care | Payer: BC Managed Care – PPO | Source: Ambulatory Visit | Attending: Adult Health | Admitting: Adult Health

## 2022-03-26 VITALS — BP 124/78 | HR 55 | Ht 59.0 in | Wt 173.0 lb

## 2022-03-26 DIAGNOSIS — Z113 Encounter for screening for infections with a predominantly sexual mode of transmission: Secondary | ICD-10-CM | POA: Insufficient documentation

## 2022-03-26 DIAGNOSIS — Z01419 Encounter for gynecological examination (general) (routine) without abnormal findings: Secondary | ICD-10-CM | POA: Diagnosis present

## 2022-03-26 DIAGNOSIS — N946 Dysmenorrhea, unspecified: Secondary | ICD-10-CM | POA: Diagnosis not present

## 2022-03-26 HISTORY — DX: Dysmenorrhea, unspecified: N94.6

## 2022-03-26 HISTORY — DX: Encounter for screening for infections with a predominantly sexual mode of transmission: Z11.3

## 2022-03-26 NOTE — Progress Notes (Signed)
Patient ID: Courtney Murray, female   DOB: January 15, 1991, 31 y.o.   MRN: [id number] ?History of Present Illness: ? ?Courtney Murray is a 31 year old black female,single, G2 P1011, in for a well woman gyn exam and pap. ?PCP is Lilyan Punt MD. ? ?Current Medications, Allergies, Past Medical History, Past Surgical History, Family History and Social History were reviewed in [employer] record.   ? ? ?Review of Systems: ?Patient denies any headaches, hearing loss, fatigue, blurred vision, shortness of breath, chest pain, abdominal pain, problems with bowel movements, urination, or intercourse. No joint pain or mood swings.  ?Has menstrual cramps at times and Midol helps ?PMS at times too ? ? ?Physical Exam:BP 124/78 (BP Location: Right Arm, Patient Position: Sitting, Cuff Size: Normal)   Pulse (!) 55   Ht 4\' 11"  (1.499 m)   Wt 173 lb (78.5 kg)   LMP 03/18/2022 (Exact Date)   BMI 34.94 kg/m?   ?General:  Well developed, well nourished, no acute distress ?Skin:  Warm and dry ?Neck:  Midline trachea, normal thyroid, good ROM, no lymphadenopathy ?Lungs; Clear to auscultation bilaterally ?Breast:  No dominant palpable mass, retraction, or nipple discharge ?Cardiovascular: Regular rate and rhythm ?Abdomen:  Soft, non tender, no hepatosplenomegaly ?Pelvic:  External genitalia is normal in appearance, no lesions.  The vagina is normal in appearance. Urethra has no lesions or masses. The cervix is bulbous. Pap with GC/CHL and HR HPV genotyping performed. Uterus is felt to be normal size, shape, and contour.  No adnexal masses or tenderness noted.Bladder is non tender, no masses felt. ?Extremities/musculoskeletal:  No swelling or varicosities noted, no clubbing or cyanosis ?Psych:  No mood changes, alert and cooperative,seems happy ?AA is 2 ?Fall risk is low ? ?  03/26/2022  ? 11:39 AM 06/26/2020  ? 10:18 AM 05/24/2019  ? 11:25 AM  ?Depression screen PHQ 2/9  ?Decreased Interest 0 0 0  ?Down, Depressed, Hopeless 0 0 0   ?PHQ - 2 Score 0 0 0  ?Altered sleeping 0  1  ?Tired, decreased energy 0  0  ?Change in appetite 1  0  ?Feeling bad or failure about yourself  0  0  ?Trouble concentrating 0  0  ?Moving slowly or fidgety/restless 0  0  ?Suicidal thoughts 0  0  ?PHQ-9 Score 1  1  ?Difficult doing work/chores   Not difficult at all  ?  ? ?  03/26/2022  ? 11:40 AM  ?GAD 7 : Generalized Anxiety Score  ?Nervous, Anxious, on Edge 0  ?Control/stop worrying 0  ?Worry too much - different things 0  ?Trouble relaxing 0  ?Restless 0  ?Easily annoyed or irritable 0  ?Afraid - awful might happen 0  ?Total GAD 7 Score 0  ? ? Upstream - 03/26/22 1138   ? ?  ? Pregnancy Intention Screening  ? Does the patient want to become pregnant in the next year? Ok Either Way   ? Does the patient's partner want to become pregnant in the next year? Ok Either Way   ? Would the patient like to discuss contraceptive options today? No   ?  ? Contraception Wrap Up  ? Current Method No Contraceptive Precautions   ? End Method No Contraception Precautions   ? Contraception Counseling Provided No   ? ?  ?  ? ?  ?  ? Examination chaperoned by 05/26/22 LPN ? ?Impression and Plan: ?1. Encounter for gynecological examination with Papanicolaou smear of cervix ?  Pap sent  ?Pap in 3 years if normal ?Physical in 1 year ?Check labs, orders given to pt  ?- Cytology - PAP( [place]) ?- CBC ?- Comprehensive metabolic panel ?- TSH ?- Lipid panel ? ?2. Screen for STD (sexually transmitted disease) ?GC/CHL on pap ?- Cytology - PAP( [place]) ? ?3. Menstrual cramps ?Using Midol prn   ? ? ? ?  ?  ?

## 2022-04-01 ENCOUNTER — Other Ambulatory Visit: Payer: Self-pay | Admitting: Adult Health

## 2022-04-01 LAB — CYTOLOGY - PAP
Chlamydia: NEGATIVE
Comment: NEGATIVE
Comment: NEGATIVE
Comment: NORMAL
Diagnosis: NEGATIVE
High risk HPV: NEGATIVE
Neisseria Gonorrhea: NEGATIVE

## 2022-04-01 MED ORDER — METRONIDAZOLE 500 MG PO TABS: 500.0000 mg | ORAL_TABLET | Freq: Two times a day (BID) | ORAL | 0 refills | Status: DC

## 2022-04-01 NOTE — Progress Notes (Signed)
+  BV on Pap, will rx flagyl

## 2022-04-28 ENCOUNTER — Encounter: Payer: Self-pay | Admitting: Adult Health

## 2022-04-28 ENCOUNTER — Ambulatory Visit (INDEPENDENT_AMBULATORY_CARE_PROVIDER_SITE_OTHER): Payer: BC Managed Care – PPO | Admitting: Adult Health

## 2022-04-28 VITALS — BP 122/71 | HR 83 | Ht 59.5 in | Wt 174.5 lb

## 2022-04-28 DIAGNOSIS — N926 Irregular menstruation, unspecified: Secondary | ICD-10-CM

## 2022-04-28 DIAGNOSIS — Z3202 Encounter for pregnancy test, result negative: Secondary | ICD-10-CM | POA: Diagnosis not present

## 2022-04-28 HISTORY — DX: Irregular menstruation, unspecified: N92.6

## 2022-04-28 LAB — POCT URINE PREGNANCY: Preg Test, Ur: NEGATIVE

## 2022-04-28 NOTE — Progress Notes (Signed)
  Subjective:     Patient ID: Courtney Murray, female   DOB: 1991/07/20, 31 y.o.   MRN: [id number]  HPI Courtney Murray is a 31 year old black female,single, G2P1011 in for UPT, has not had period for June, she says is 10 days late, but having some cramping like going to start.  Lab Results  Component Value Date   DIAGPAP  03/26/2022    - Negative for intraepithelial lesion or malignancy (NILM)   HPVHIGH Negative 03/26/2022   PCP is Courtney Murray  Review of Systems Missed period for June was due 10 days ago Some cramping,like might start   Reviewed past medical,surgical, social and family history. Reviewed medications and allergies.  Objective:   Physical Exam BP 122/71 (BP Location: Left Arm, Patient Position: Sitting, Cuff Size: Normal)   Pulse 83   Ht 4' 11.5" (1.511 m)   Wt 174 lb 8 oz (79.2 kg)   LMP 03/18/2022   BMI 34.65 kg/m     UPT is negative Skin warm and dry. Lungs: clear to ausculation bilaterally. Cardiovascular: regular rate and rhythm.   Upstream - 04/28/22 1435       Pregnancy Intention Screening   Does the patient want to become pregnant in the next year? Ok Either Way    Does the patient's partner want to become pregnant in the next year? Ok Either Way    Would the patient like to discuss contraceptive options today? No      Contraception Wrap Up   Current Method No Method - Other Reason    End Method No Method - Other Reason             Assessment:     1. Pregnancy examination or test, negative result  2. Missed period Will wait and see if starts late, if no period after July 4, call me      Plan:     Reminded to get labs done from physical 03/26/22   Follow up prn

## 2022-04-30 LAB — LIPID PANEL
Chol/HDL Ratio: 2.9 ratio (ref 0.0–4.4)
Cholesterol, Total: 159 mg/dL (ref 100–199)
HDL: 55 mg/dL (ref >39)
LDL Chol Calc (NIH): 87 mg/dL (ref 0–99)
Triglycerides: 90 mg/dL (ref 0–149)
VLDL Cholesterol Cal: 17 mg/dL (ref 5–40)

## 2022-04-30 LAB — TSH: TSH: 2.45 u[IU]/mL (ref 0.450–4.500)

## 2022-04-30 LAB — COMPREHENSIVE METABOLIC PANEL
ALT: 21 IU/L (ref 0–32)
AST: 18 IU/L (ref 0–40)
Albumin/Globulin Ratio: 1.5 (ref 1.2–2.2)
Albumin: 4 g/dL (ref 3.8–4.8)
Alkaline Phosphatase: 97 IU/L (ref 44–121)
BUN/Creatinine Ratio: 11 (ref 9–23)
BUN: 10 mg/dL (ref 6–20)
Bilirubin Total: 0.8 mg/dL (ref 0.0–1.2)
CO2: 23 mmol/L (ref 20–29)
Calcium: 9.3 mg/dL (ref 8.7–10.2)
Chloride: 102 mmol/L (ref 96–106)
Creatinine, Ser: 0.9 mg/dL (ref 0.57–1.00)
Globulin, Total: 2.6 g/dL (ref 1.5–4.5)
Glucose: 91 mg/dL (ref 70–99)
Potassium: 4.5 mmol/L (ref 3.5–5.2)
Sodium: 139 mmol/L (ref 134–144)
Total Protein: 6.6 g/dL (ref 6.0–8.5)
eGFR: 88 mL/min/{1.73_m2} (ref >59)

## 2022-04-30 LAB — CBC
Hematocrit: 44.7 % (ref 34.0–46.6)
Hemoglobin: 14.2 g/dL (ref 11.1–15.9)
MCH: 25.7 pg — ABNORMAL LOW (ref 26.6–33.0)
MCHC: 31.8 g/dL (ref 31.5–35.7)
MCV: 81 fL (ref 79–97)
Platelets: 261 10*3/uL (ref 150–450)
RBC: 5.52 x10E6/uL — ABNORMAL HIGH (ref 3.77–5.28)
RDW: 14.6 % (ref 11.7–15.4)
WBC: 8.1 10*3/uL (ref 3.4–10.8)

## 2022-10-13 ENCOUNTER — Encounter (HOSPITAL_COMMUNITY): Payer: Self-pay

## 2022-10-13 ENCOUNTER — Other Ambulatory Visit: Payer: Self-pay

## 2022-10-13 ENCOUNTER — Emergency Department (HOSPITAL_COMMUNITY)
Admission: EM | Admit: 2022-10-13 | Discharge: 2022-10-14 | Disposition: A | Discharge status: Home / Self Care | Payer: BC Managed Care – PPO | Attending: Emergency Medicine | Admitting: Emergency Medicine

## 2022-10-13 DIAGNOSIS — J101 Influenza due to other identified influenza virus with other respiratory manifestations: Secondary | ICD-10-CM | POA: Diagnosis not present

## 2022-10-13 DIAGNOSIS — J111 Influenza due to unidentified influenza virus with other respiratory manifestations: Secondary | ICD-10-CM

## 2022-10-13 DIAGNOSIS — R519 Headache, unspecified: Secondary | ICD-10-CM | POA: Diagnosis present

## 2022-10-13 DIAGNOSIS — Z91010 Allergy to peanuts: Secondary | ICD-10-CM | POA: Insufficient documentation

## 2022-10-13 DIAGNOSIS — Z1152 Encounter for screening for COVID-19: Secondary | ICD-10-CM | POA: Insufficient documentation

## 2022-10-13 NOTE — ED Triage Notes (Signed)
POV from home. Cc of body aches and spasms. As well as cough and headache. Works at a [employer]. Family member has the flu. Marland Kitchen

## 2022-10-14 LAB — RESP PANEL BY RT-PCR (FLU A&B, COVID) ARPGX2
Influenza A by PCR: NEGATIVE
Influenza B by PCR: POSITIVE — AB
SARS Coronavirus 2 by RT PCR: NEGATIVE

## 2022-10-14 MED ORDER — OSELTAMIVIR PHOSPHATE 75 MG PO CAPS: 75.0000 mg | ORAL_CAPSULE | Freq: Two times a day (BID) | ORAL | 0 refills | Status: DC

## 2022-10-14 NOTE — ED Notes (Signed)
Went over dc papers. All questions answered. Ambulatory to lobby .  

## 2022-10-14 NOTE — ED Provider Notes (Signed)
[facility] EMERGENCY DEPARTMENT Provider Note   CSN: [id number] Arrival date & time: 10/13/22  2259     History  Chief Complaint  Patient presents with   Generalized Body Aches    Courtney Murray is a 31 y.o. female.  Patient presents to the emergency department for evaluation of headache, generalized bodyaches, chills.  Patient does have a flu contact at home.       Home Medications Prior to Admission medications   Medication Sig Start Date End Date Taking? Authorizing Provider  oseltamivir (TAMIFLU) 75 MG capsule Take 1 capsule (75 mg total) by mouth every 12 (twelve) hours. 10/14/22  Yes Rhapsody Wolven, Canary Brim, MD  cetirizine (ZYRTEC ALLERGY) 10 MG tablet Take 1 tablet (10 mg total) by mouth daily. 02/11/22   [employer]med[employer]ite, Alvino Chapel, FNP      Allergies    Almond (diagnostic) and Peanut-containing drug products    Review of Systems   Review of Systems  Physical Exam Updated Vital Signs BP 105/60   Pulse 98   Temp 98.4 F (36.9 C) (Oral)   Resp 19   Ht 5' (1.524 m)   Wt 79.2 kg   SpO2 95%   BMI 34.10 kg/m  Physical Exam Vitals and nursing note reviewed.  Constitutional:      General: She is not in acute distress.    Appearance: She is well-developed.  HENT:     Head: Normocephalic and atraumatic.     Mouth/Throat:     Mouth: Mucous membranes are moist.  Eyes:     General: Vision grossly intact. Gaze aligned appropriately.     Extraocular Movements: Extraocular movements intact.     Conjunctiva/sclera: Conjunctivae normal.  Cardiovascular:     Rate and Rhythm: Normal rate and regular rhythm.     Pulses: Normal pulses.     Heart sounds: Normal heart sounds, S1 normal and S2 normal. No murmur heard.    No friction rub. No gallop.  Pulmonary:     Effort: Pulmonary effort is normal. No respiratory distress.     Breath sounds: Normal breath sounds.  Abdominal:     General: Bowel sounds are normal.     Palpations: Abdomen is soft.     Tenderness: There is  no abdominal tenderness. There is no guarding or rebound.     Hernia: No hernia is present.  Musculoskeletal:        General: No swelling.     Cervical back: Full passive range of motion without pain, normal range of motion and neck supple. No spinous process tenderness or muscular tenderness. Normal range of motion.     Right lower leg: No edema.     Left lower leg: No edema.  Skin:    General: Skin is warm and dry.     Capillary Refill: Capillary refill takes less than 2 seconds.     Findings: No ecchymosis, erythema, rash or wound.  Neurological:     General: No focal deficit present.     Mental Status: She is alert and oriented to person, place, and time.     GCS: GCS eye subscore is 4. GCS verbal subscore is 5. GCS motor subscore is 6.     Cranial Nerves: Cranial nerves 2-12 are intact.     Sensory: Sensation is intact.     Motor: Motor function is intact.     Coordination: Coordination is intact.  Psychiatric:        Attention and Perception: Attention normal.  Mood and Affect: Mood normal.        Speech: Speech normal.        Behavior: Behavior normal.     ED Results / Procedures / Treatments   Labs (all labs ordered are listed, but only abnormal results are displayed) Labs Reviewed  RESP PANEL BY RT-PCR (FLU A&B, COVID) ARPGX2 - Abnormal; Notable for the following components:      Result Value   Influenza B by PCR POSITIVE (*)    All other components within normal limits    EKG None  Radiology No results found.  Procedures Procedures    Medications Ordered in ED Medications - No data to display  ED Course/ Medical Decision Making/ A&P                           Medical Decision Making  Patient with positive flu contact comes in with flulike illness.  Influenza B is positive.  Patient appears well.  No vomiting or dehydration.  Lungs are clear.  Symptoms began today, will offer Tamiflu, symptomatic treatment.        Final Clinical  Impression(s) / ED Diagnoses Final diagnoses:  Influenza    Rx / DC Orders ED Discharge Orders          Ordered    oseltamivir (TAMIFLU) 75 MG capsule  Every 12 hours        10/14/22 0101              Gilda Crease, MD 10/14/22 [phone]

## 2022-10-15 ENCOUNTER — Telehealth: Payer: Self-pay

## 2022-10-15 NOTE — Telephone Encounter (Signed)
Transition Care Management Follow-up Telephone Call Date of discharge and from where: Courtney Murray 10/14/2022 How have you been since you were released from the hospital? weak Any questions or concerns? Yes  Items Reviewed: Did the pt receive and understand the discharge instructions provided? Yes  Medications obtained and verified? no Other? No  Any new allergies since your discharge? Yes  Dietary orders reviewed? Yes Do you have support at home? Yes   Home Care and Equipment/Supplies: Were home health services ordered? no If so, what is the name of the agency? N/a  Has the agency set up a time to come to the patient's home? not applicable Were any new equipment or medical supplies ordered?  No What is the name of the medical supply agency? N/a Were you able to get the supplies/equipment? no Do you have any questions related to the use of the equipment or supplies? No  Functional Questionnaire: (I = Independent and D = Dependent) ADLs: I  Bathing/Dressing- I  Meal Prep- I  Eating- I  Maintaining continence- I  Transferring/Ambulation- I  Managing Meds- I  Follow up appointments reviewed:  PCP Hospital f/u appt confirmed? No  Patient declined appt Specialist Hospital f/u appt confirmed? No  Are transportation arrangements needed? No  If their condition worsens, is the pt aware to call PCP or go to the Emergency Dept.? Yes Was the patient provided with contact information for the PCP's office or ED? Yes Was to pt encouraged to call back with questions or concerns? Yes Karena Addison, LPN [facility] Nurse Health Advisor Direct Dial [phone]

## 2022-11-18 ENCOUNTER — Other Ambulatory Visit (INDEPENDENT_AMBULATORY_CARE_PROVIDER_SITE_OTHER): Payer: BC Managed Care – PPO

## 2022-11-18 ENCOUNTER — Other Ambulatory Visit (HOSPITAL_COMMUNITY)
Admission: RE | Admit: 2022-11-18 | Discharge: 2022-11-18 | Disposition: A | Discharge status: Home / Self Care | Payer: BC Managed Care – PPO | Source: Ambulatory Visit | Attending: Obstetrics & Gynecology | Admitting: Obstetrics & Gynecology

## 2022-11-18 DIAGNOSIS — N898 Other specified noninflammatory disorders of vagina: Secondary | ICD-10-CM | POA: Diagnosis present

## 2022-11-18 DIAGNOSIS — L292 Pruritus vulvae: Secondary | ICD-10-CM | POA: Insufficient documentation

## 2022-11-18 DIAGNOSIS — B9689 Other specified bacterial agents as the cause of diseases classified elsewhere: Secondary | ICD-10-CM | POA: Insufficient documentation

## 2022-11-18 DIAGNOSIS — Z113 Encounter for screening for infections with a predominantly sexual mode of transmission: Secondary | ICD-10-CM | POA: Insufficient documentation

## 2022-11-18 DIAGNOSIS — N760 Acute vaginitis: Secondary | ICD-10-CM | POA: Diagnosis not present

## 2022-11-18 DIAGNOSIS — B3731 Acute candidiasis of vulva and vagina: Secondary | ICD-10-CM | POA: Diagnosis not present

## 2022-11-18 NOTE — Progress Notes (Signed)
   NURSE VISIT- VAGINITIS/STD/POC  SUBJECTIVE:  Courtney Murray is a 32 y.o. G2P1011 GYN patientfemale here for a vaginal swab for vaginitis/STD screening.  She reports the following symptoms: discharge described as white and vulvar itching for 1 day. Denies abnormal vaginal bleeding, significant pelvic pain, fever, or UTI symptoms.  OBJECTIVE:  There were no vitals taken for this visit.  Appears well, in no apparent distress  ASSESSMENT: Vaginal swab for vaginitis/STD screening  PLAN: Self-collected vaginal probe for Gonorrhea, Chlamydia, Trichomonas, Bacterial Vaginosis, Yeast sent to lab Treatment: to be determined once results are received Follow-up as needed if symptoms persist/worsen, or new symptoms develop  Andrez Grime  11/18/2022 11:42 AM

## 2022-11-19 LAB — CERVICOVAGINAL ANCILLARY ONLY
Bacterial Vaginitis (gardnerella): POSITIVE — AB
Candida Glabrata: NEGATIVE
Candida Vaginitis: POSITIVE — AB
Chlamydia: NEGATIVE
Comment: NEGATIVE
Comment: NEGATIVE
Comment: NEGATIVE
Comment: NEGATIVE
Comment: NEGATIVE
Comment: NORMAL
Neisseria Gonorrhea: NEGATIVE
Trichomonas: NEGATIVE

## 2022-11-22 ENCOUNTER — Other Ambulatory Visit: Payer: Self-pay | Admitting: Adult Health

## 2022-11-22 MED ORDER — METRONIDAZOLE 500 MG PO TABS: 500.0000 mg | ORAL_TABLET | Freq: Two times a day (BID) | ORAL | 0 refills | Status: DC

## 2022-11-22 MED ORDER — FLUCONAZOLE 150 MG PO TABS: ORAL_TABLET | ORAL | 1 refills | Status: DC

## 2022-11-22 NOTE — Progress Notes (Signed)
+  BV and yeast on vaginal swab will rx flagyl and diflucan, no alcohol or sex while taking meds

## 2023-01-03 ENCOUNTER — Emergency Department (HOSPITAL_COMMUNITY)
Admission: EM | Admit: 2023-01-03 | Discharge: 2023-01-04 | Disposition: A | Discharge status: Home / Self Care | Payer: BC Managed Care – PPO | Attending: Emergency Medicine | Admitting: Emergency Medicine

## 2023-01-03 ENCOUNTER — Encounter (HOSPITAL_COMMUNITY): Payer: Self-pay

## 2023-01-03 ENCOUNTER — Other Ambulatory Visit: Payer: Self-pay

## 2023-01-03 ENCOUNTER — Emergency Department (HOSPITAL_COMMUNITY): Payer: BC Managed Care – PPO

## 2023-01-03 DIAGNOSIS — R109 Unspecified abdominal pain: Secondary | ICD-10-CM | POA: Diagnosis not present

## 2023-01-03 DIAGNOSIS — Z91010 Allergy to peanuts: Secondary | ICD-10-CM | POA: Insufficient documentation

## 2023-01-03 LAB — POC URINE PREG, ED: Preg Test, Ur: NEGATIVE

## 2023-01-03 NOTE — ED Notes (Signed)
Patient being transferred to CT for renal stone study.

## 2023-01-03 NOTE — ED Provider Notes (Signed)
[facility] Provider Note   CSN: [id number] Arrival date & time: 01/03/23  2258     History {Add pertinent medical, surgical, social history, OB history to HPI:1} Chief Complaint  Patient presents with   Abdominal Pain    [place] is a 32 y.o. female.  Patient presents to the emergency department for evaluation of right flank pain.  Patient reports that the pain began more than a week ago.  At first it was not very noticeable but has worsened.  Patient reports that the pain worsens when she bends or twists.  She denies any injury.  No midline back pain, no radiation to the leg.  No numbness, tingling or weakness of extremities.  She has not had associated nausea, vomiting or diarrhea.  No change in urinary symptoms.       Home Medications Prior to Admission medications   Medication Sig Start Date End Date Taking? Authorizing Provider  fluconazole (DIFLUCAN) 150 MG tablet Take 1 now and 1 in 3 days 11/22/22   Estill Dooms, NP  metroNIDAZOLE (FLAGYL) 500 MG tablet Take 1 tablet (500 mg total) by mouth 2 (two) times daily. 11/22/22   Estill Dooms, NP  cetirizine (ZYRTEC ALLERGY) 10 MG tablet Take 1 tablet (10 mg total) by mouth daily. Patient not taking: Reported on 11/18/2022 02/11/22   Ameduite, Trenton Gammon, FNP  oseltamivir (TAMIFLU) 75 MG capsule Take 1 capsule (75 mg total) by mouth every 12 (twelve) hours. Patient not taking: Reported on 11/18/2022 10/14/22   Orpah Greek, MD      Allergies    Almond (diagnostic) and Peanut-containing drug products    Review of Systems   Review of Systems  Physical Exam Updated Vital Signs BP 109/78   Pulse 79   Temp 97.7 F (36.5 C)   Resp 18   Wt 78 kg   SpO2 100%   BMI 33.59 kg/m  Physical Exam Vitals and nursing note reviewed.  Constitutional:      General: She is not in acute distress.    Appearance: She is well-developed.  HENT:     Head: Normocephalic  and atraumatic.     Mouth/Throat:     Mouth: Mucous membranes are moist.  Eyes:     General: Vision grossly intact. Gaze aligned appropriately.     Extraocular Movements: Extraocular movements intact.     Conjunctiva/sclera: Conjunctivae normal.  Cardiovascular:     Rate and Rhythm: Normal rate and regular rhythm.     Pulses: Normal pulses.     Heart sounds: Normal heart sounds, S1 normal and S2 normal. No murmur heard.    No friction rub. No gallop.  Pulmonary:     Effort: Pulmonary effort is normal. No respiratory distress.     Breath sounds: Normal breath sounds.  Abdominal:     General: Bowel sounds are normal.     Palpations: Abdomen is soft.     Tenderness: There is no abdominal tenderness. There is no guarding or rebound.     Hernia: No hernia is present.       Comments: Tender right lateral abdomen, no right upper quadrant tenderness, no McBurney's point tenderness.  Pain reproduced with movement.  Musculoskeletal:        General: No swelling.     Cervical back: Full passive range of motion without pain, normal range of motion and neck supple. No spinous process tenderness or muscular tenderness. Normal range of motion.  Right lower leg: No edema.     Left lower leg: No edema.  Skin:    General: Skin is warm and dry.     Capillary Refill: Capillary refill takes less than 2 seconds.     Findings: No ecchymosis, erythema, rash or wound.  Neurological:     General: No focal deficit present.     Mental Status: She is alert and oriented to person, place, and time.     GCS: GCS eye subscore is 4. GCS verbal subscore is 5. GCS motor subscore is 6.     Cranial Nerves: Cranial nerves 2-12 are intact.     Sensory: Sensation is intact.     Motor: Motor function is intact.     Coordination: Coordination is intact.  Psychiatric:        Attention and Perception: Attention normal.        Mood and Affect: Mood normal.        Speech: Speech normal.        Behavior: Behavior  normal.     ED Results / Procedures / Treatments   Labs (all labs ordered are listed, but only abnormal results are displayed) Labs Reviewed  LIPASE, BLOOD  COMPREHENSIVE METABOLIC PANEL  CBC  URINALYSIS, ROUTINE W REFLEX MICROSCOPIC  POC URINE PREG, ED    EKG None  Radiology No results found.  Procedures Procedures  {Document cardiac monitor, telemetry assessment procedure when appropriate:1}  Medications Ordered in ED Medications - No data to display  ED Course/ Medical Decision Making/ A&P   {   Click here for ABCD2, HEART and other calculatorsREFRESH Note before signing :1}                          Medical Decision Making Amount and/or Complexity of Data Reviewed Labs: ordered. Radiology: ordered.   ***  {Document critical care time when appropriate:1} {Document review of labs and clinical decision tools ie heart score, Chads2Vasc2 etc:1}  {Document your independent review of radiology images, and any outside records:1} {Document your discussion with family members, caretakers, and with consultants:1} {Document social determinants of health affecting pt's care:1} {Document your decision making why or why not admission, treatments were needed:1} Final Clinical Impression(s) / ED Diagnoses Final diagnoses:  None    Rx / DC Orders ED Discharge Orders     None

## 2023-01-03 NOTE — ED Triage Notes (Signed)
Pt presents with right sided abdominal that started x1 week ago and has gotten worse. Denies any N/V/D or urinary symptoms. Described pain as intermittent and feels dull.

## 2023-01-04 LAB — COMPREHENSIVE METABOLIC PANEL
ALT: 18 U/L (ref 0–44)
AST: 19 U/L (ref 15–41)
Albumin: 3.6 g/dL (ref 3.5–5.0)
Alkaline Phosphatase: 82 U/L (ref 38–126)
Anion gap: 8 (ref 5–15)
BUN: 10 mg/dL (ref 6–20)
CO2: 26 mmol/L (ref 22–32)
Calcium: 8.9 mg/dL (ref 8.9–10.3)
Chloride: 103 mmol/L (ref 98–111)
Creatinine, Ser: 0.84 mg/dL (ref 0.44–1.00)
GFR, Estimated: >60 mL/min (ref >60)
Glucose, Bld: 104 mg/dL — ABNORMAL HIGH (ref 70–99)
Potassium: 3.9 mmol/L (ref 3.5–5.1)
Sodium: 137 mmol/L (ref 135–145)
Total Bilirubin: 0.3 mg/dL (ref 0.3–1.2)
Total Protein: 7.1 g/dL (ref 6.5–8.1)

## 2023-01-04 LAB — CBC
HCT: 44.3 % (ref 36.0–46.0)
Hemoglobin: 13.9 g/dL (ref 12.0–15.0)
MCH: 25.6 pg — ABNORMAL LOW (ref 26.0–34.0)
MCHC: 31.4 g/dL (ref 30.0–36.0)
MCV: 81.4 fL (ref 80.0–100.0)
Platelets: 277 10*3/uL (ref 150–400)
RBC: 5.44 MIL/uL — ABNORMAL HIGH (ref 3.87–5.11)
RDW: 15 % (ref 11.5–15.5)
WBC: 10.2 10*3/uL (ref 4.0–10.5)
nRBC: 0 % (ref 0.0–0.2)

## 2023-01-04 LAB — URINALYSIS, ROUTINE W REFLEX MICROSCOPIC
Bilirubin Urine: NEGATIVE
Glucose, UA: NEGATIVE mg/dL
Ketones, ur: NEGATIVE mg/dL
Nitrite: NEGATIVE
Protein, ur: 30 mg/dL — AB
RBC / HPF: >50 RBC/hpf (ref 0–5)
Specific Gravity, Urine: 1.019 (ref 1.005–1.030)
pH: 6 (ref 5.0–8.0)

## 2023-01-04 LAB — LIPASE, BLOOD: Lipase: 28 U/L (ref 11–51)

## 2023-01-04 MED ORDER — METHOCARBAMOL 500 MG PO TABS: 500.0000 mg | ORAL_TABLET | Freq: Three times a day (TID) | ORAL | 0 refills | Status: DC | PRN

## 2023-01-04 MED ORDER — IBUPROFEN 800 MG PO TABS: 800.0000 mg | ORAL_TABLET | Freq: Four times a day (QID) | ORAL | 0 refills | Status: DC | PRN

## 2023-01-04 NOTE — ED Notes (Signed)
Patient return from CT

## 2023-01-05 ENCOUNTER — Telehealth: Payer: Self-pay

## 2023-01-05 NOTE — Transitions of Care (Post Inpatient/ED Visit) (Signed)
   01/05/2023  Name: Courtney Murray MRN: [id number] DOB: 1990/11/26  Today's TOC FU Call Status: Today's TOC FU Call Status:: Successful TOC FU Call Competed TOC FU Call Complete Date: 01/05/23  Transition Care Management Follow-up Telephone Call Date of Discharge: 01/04/23 Discharge [facility] Type of Discharge: Emergency Department How have you been since you were released from the hospital?: Better Any questions or concerns?: No  Items Reviewed: Did you receive and understand the discharge instructions provided?: Yes Medications obtained and verified?: Yes (Medications Reviewed) Any new allergies since your discharge?: No Dietary orders reviewed?: NA Do you have support at home?: Yes People in Home: friend(s)  Home Care and Equipment/Supplies: [place] Ordered?: NA Any new equipment or medical supplies ordered?: NA  Functional Questionnaire: Do you need assistance with bathing/showering or dressing?: No Do you need assistance with meal preparation?: No Do you need assistance with eating?: No Do you have difficulty maintaining continence: No Do you need assistance with getting out of bed/getting out of a chair/moving?: No Do you have difficulty managing or taking your medications?: No  Folllow up appointments reviewed: PCP Follow-up appointment confirmed?: NA (declined) Do you need transportation to your follow-up appointment?: No Do you understand care options if your condition(s) worsen?: Yes-patient verbalized understanding    [place], [place] Nurse Health Advisor Direct Dial [phone]

## 2023-01-12 ENCOUNTER — Inpatient Hospital Stay: Payer: BC Managed Care – PPO | Admitting: Family Medicine

## 2023-04-18 ENCOUNTER — Other Ambulatory Visit (HOSPITAL_COMMUNITY)
Admission: RE | Admit: 2023-04-18 | Discharge: 2023-04-18 | Disposition: A | Discharge status: Home / Self Care | Payer: BC Managed Care – PPO | Source: Ambulatory Visit | Attending: Adult Health | Admitting: Adult Health

## 2023-04-18 ENCOUNTER — Ambulatory Visit (INDEPENDENT_AMBULATORY_CARE_PROVIDER_SITE_OTHER): Payer: BC Managed Care – PPO | Admitting: Adult Health

## 2023-04-18 ENCOUNTER — Encounter: Payer: Self-pay | Admitting: Adult Health

## 2023-04-18 VITALS — BP 125/82 | HR 89 | Ht 59.5 in | Wt 184.0 lb

## 2023-04-18 DIAGNOSIS — Z113 Encounter for screening for infections with a predominantly sexual mode of transmission: Secondary | ICD-10-CM | POA: Insufficient documentation

## 2023-04-18 DIAGNOSIS — Z01419 Encounter for gynecological examination (general) (routine) without abnormal findings: Secondary | ICD-10-CM

## 2023-04-18 NOTE — Progress Notes (Signed)
Patient ID: Courtney Murray, female   DOB: Jan 18, 1991, 32 y.o.   MRN: [id number] History of Present Illness: Courtney Murray is a 32 year old black female,with SO, G2P1011, in for a well woman gyn exam.    Component Value Date/Time   DIAGPAP  03/26/2022 1143    - Negative for intraepithelial lesion or malignancy (NILM)   DIAGPAP  05/24/2019 0000    NEGATIVE FOR INTRAEPITHELIAL LESIONS OR MALIGNANCY.   HPVHIGH Negative 03/26/2022 1143   ADEQPAP  03/26/2022 1143    Satisfactory for evaluation; transformation zone component PRESENT.   ADEQPAP  05/24/2019 0000    Satisfactory for evaluation  endocervical/transformation zone component PRESENT.    PCP is Dr Lilyan Punt.   Current Medications, Allergies, Past Medical History, Past Surgical History, Family History and Social History were reviewed in [employer] record.     Review of Systems: Patient denies any headaches, hearing loss, fatigue, blurred vision, shortness of breath, chest pain, abdominal pain, problems with bowel movements, urination, or intercourse. No joint pain or mood swings.  Periods monthly.   Physical Exam:BP 125/82 (BP Location: Left Arm, Patient Position: Sitting, Cuff Size: Normal)   Pulse 89   Ht 4' 11.5" (1.511 m)   Wt 184 lb (83.5 kg)   LMP 03/25/2023 (Approximate)   BMI 36.54 kg/m   General:  Well developed, well nourished, no acute distress Skin:  Warm and dry Neck:  Midline trachea, normal thyroid, good ROM, no lymphadenopathy Lungs; Clear to auscultation bilaterally Breast:  No dominant palpable mass, retraction, or nipple discharge Cardiovascular: Regular rate and rhythm Abdomen:  Soft, non tender, no hepatosplenomegaly Pelvic:  External genitalia is normal in appearance, no lesions.  The vagina is normal in appearance. Urethra has no lesions or masses. The cervix is bulbous.  Uterus is felt to be normal size, shape, and contour.  No adnexal masses or tenderness noted.Bladder is non  tender, no masses felt. CV swab obtained. Extremities/musculoskeletal:  No swelling or varicosities noted, no clubbing or cyanosis Psych:  No mood changes, alert and cooperative,seems happy AA is 1 Fall risk is moderate    04/18/2023    2:32 PM 03/26/2022   11:39 AM 06/26/2020   10:18 AM  Depression screen PHQ 2/9  Decreased Interest 0 0 0  Down, Depressed, Hopeless 0 0 0  PHQ - 2 Score 0 0 0  Altered sleeping 0 0   Tired, decreased energy 0 0   Change in appetite 0 1   Feeling bad or failure about yourself  0 0   Trouble concentrating 0 0   Moving slowly or fidgety/restless 0 0   Suicidal thoughts 0 0   PHQ-9 Score 0 1        04/18/2023    2:32 PM 03/26/2022   11:40 AM  GAD 7 : Generalized Anxiety Score  Nervous, Anxious, on Edge 0 0  Control/stop worrying 0 0  Worry too much - different things 0 0  Trouble relaxing 0 0  Restless 0 0  Easily annoyed or irritable 0 0  Afraid - awful might happen 0 0  Total GAD 7 Score 0 0    Upstream - 04/18/23 1439       Pregnancy Intention Screening   Does the patient want to become pregnant in the next year? Ok Either Way    Does the patient's partner want to become pregnant in the next year? Ok Either Way    Would the patient like to discuss  contraceptive options today? No      Contraception Wrap Up   Current Method No Method - Other Reason    Reason for No Current Contraceptive Method at Intake ([facility] Only) Other    End Method No Method - Other Reason            Examination chaperoned by Erick Alley DO.     Impression and plan: 1. Encounter for well woman exam with routine gynecological exam Physical in 1 year  Pap in 2026 Take OTC PNV  2. Screen for STD (sexually transmitted disease) CV swab sent for GC/CHL,trich,BV and yeast - Cervicovaginal ancillary only( [place])

## 2023-04-20 LAB — CERVICOVAGINAL ANCILLARY ONLY
Bacterial Vaginitis (gardnerella): NEGATIVE
Candida Glabrata: NEGATIVE
Candida Vaginitis: NEGATIVE
Chlamydia: NEGATIVE
Comment: NEGATIVE
Comment: NEGATIVE
Comment: NEGATIVE
Comment: NEGATIVE
Comment: NEGATIVE
Comment: NORMAL
Neisseria Gonorrhea: NEGATIVE
Trichomonas: NEGATIVE

## 2023-05-11 ENCOUNTER — Ambulatory Visit: Payer: BC Managed Care – PPO | Admitting: Nurse Practitioner

## 2023-05-11 ENCOUNTER — Encounter: Payer: Self-pay | Admitting: Nurse Practitioner

## 2023-05-11 VITALS — BP 120/68 | HR 69 | Wt 182.4 lb

## 2023-05-11 DIAGNOSIS — D2230 Melanocytic nevi of unspecified part of face: Secondary | ICD-10-CM | POA: Diagnosis not present

## 2023-05-11 NOTE — Progress Notes (Signed)
   Subjective:    Patient ID: Courtney Murray, female    DOB: [birth year], 32 y.o.   MRN: [id number]  HPI Patient arrives today with mole on her forehead. Patient states it started hurting the end of last week to wash her face.  Much more tender last week, has improved.  Slight itching.  No drainage.  No fever.  States the mole has been there since she was born.  Has no interest at this time and having it removed.  States her grandmother had a mole in the same position.  Has not caused her any problems up until now.       Objective:   Physical Exam A small dark nevi noted in the mid forehead area between the eyebrows.  No erythema warmth or tenderness noted around the nevi but some tenderness noted with actual palpation.  Area on top of the nevi is shiny and smooth consistent with a small amount of fluid. Today's Vitals   05/11/23 1131  BP: 120/68  Pulse: 69  Weight: 182 lb 6.4 oz (82.7 kg)   Body mass index is 36.22 kg/m.       Assessment & Plan:  Change in facial mole Continue to monitor facial area.  Expect gradual resolution.  Reviewed signs and symptoms of infection.  Call back if any problems or if patient wants to be referred to discuss removal.

## 2023-05-19 ENCOUNTER — Other Ambulatory Visit: Payer: Self-pay

## 2023-05-19 ENCOUNTER — Encounter (HOSPITAL_COMMUNITY): Payer: Self-pay

## 2023-05-19 ENCOUNTER — Emergency Department (HOSPITAL_COMMUNITY)
Admission: EM | Admit: 2023-05-19 | Discharge: 2023-05-19 | Disposition: A | Discharge status: Home / Self Care | Payer: BC Managed Care – PPO | Attending: Emergency Medicine | Admitting: Emergency Medicine

## 2023-05-19 DIAGNOSIS — Z91010 Allergy to peanuts: Secondary | ICD-10-CM | POA: Insufficient documentation

## 2023-05-19 DIAGNOSIS — R519 Headache, unspecified: Secondary | ICD-10-CM | POA: Insufficient documentation

## 2023-05-19 MED ORDER — KETOROLAC TROMETHAMINE 30 MG/ML IJ SOLN
30.0000 mg | Freq: Once | INTRAMUSCULAR | Status: AC
  Administered 2023-05-19: 30 mg via INTRAVENOUS
  Filled 2023-05-19: qty 1

## 2023-05-19 MED ORDER — METOCLOPRAMIDE HCL 5 MG/ML IJ SOLN
10.0000 mg | Freq: Once | INTRAMUSCULAR | Status: AC
  Administered 2023-05-19: 10 mg via INTRAVENOUS
  Filled 2023-05-19: qty 2

## 2023-05-19 MED ORDER — DIPHENHYDRAMINE HCL 50 MG/ML IJ SOLN
25.0000 mg | Freq: Once | INTRAMUSCULAR | Status: AC
  Administered 2023-05-19: 25 mg via INTRAVENOUS
  Filled 2023-05-19: qty 1

## 2023-05-19 MED ORDER — IBUPROFEN 800 MG PO TABS: 800.0000 mg | ORAL_TABLET | Freq: Three times a day (TID) | ORAL | 0 refills | Status: DC | PRN

## 2023-05-19 NOTE — ED Provider Notes (Signed)
[facility] EMERGENCY DEPARTMENT AT [facility] Provider Note   CSN: [id number] Arrival date & time: 05/19/23  1949     History  Chief Complaint  Patient presents with   Headache    Courtney Murray is a 32 y.o. female.  Patient complains of headache.  She has no fever no neck pain.  Patient has no past medical history  The history is provided by the patient and medical records. No language interpreter was used.  Headache Pain location:  Generalized Quality:  Dull Radiates to:  Does not radiate Severity currently:  7/10 Severity at highest:  8/10 Onset quality:  Sudden Timing:  Constant Progression:  Worsening Chronicity:  New Similar to prior headaches: no   Context: not activity   Relieved by:  Nothing Worsened by:  Nothing Associated symptoms: no abdominal pain, no back pain, no congestion, no cough, no diarrhea, no fatigue, no seizures and no sinus pressure        Home Medications Prior to Admission medications   Medication Sig Start Date End Date Taking? Authorizing Provider  ibuprofen (ADVIL) 800 MG tablet Take 1 tablet (800 mg total) by mouth every 8 (eight) hours as needed for moderate pain. 05/19/23  Yes Bethann Berkshire, MD  cetirizine (ZYRTEC ALLERGY) 10 MG tablet Take 1 tablet (10 mg total) by mouth daily. 02/11/22   Ameduite, Alvino Chapel, FNP  cyclobenzaprine (FLEXERIL) 10 MG tablet Take 10 mg by mouth 3 (three) times daily as needed. 04/04/23   [provider]      Allergies    Almond (diagnostic) and Peanut-containing drug products    Review of Systems   Review of Systems  Constitutional:  Negative for appetite change and fatigue.  HENT:  Negative for congestion, ear discharge and sinus pressure.   Eyes:  Negative for discharge.  Respiratory:  Negative for cough.   Cardiovascular:  Negative for chest pain.  Gastrointestinal:  Negative for abdominal pain and diarrhea.  Genitourinary:  Negative for frequency and hematuria.   Musculoskeletal:  Negative for back pain.  Skin:  Negative for rash.  Neurological:  Positive for headaches. Negative for seizures.  Psychiatric/Behavioral:  Negative for hallucinations.     Physical Exam Updated Vital Signs BP (!) 103/43 (BP Location: Left Arm)   Pulse 70   Temp 98.1 F (36.7 C) (Oral)   Resp 18   Ht 4\' 11"  (1.499 m)   Wt 81.6 kg   LMP 04/24/2023 (Exact Date)   SpO2 97%   BMI 36.36 kg/m  Physical Exam Vitals and nursing note reviewed.  Constitutional:      Appearance: She is well-developed.  HENT:     Head: Normocephalic.     Nose: Nose normal.  Eyes:     General: No scleral icterus.    Conjunctiva/sclera: Conjunctivae normal.  Neck:     Thyroid: No thyromegaly.  Cardiovascular:     Rate and Rhythm: Normal rate and regular rhythm.     Heart sounds: No murmur heard.    No friction rub. No gallop.  Pulmonary:     Breath sounds: No stridor. No wheezing or rales.  Chest:     Chest wall: No tenderness.  Abdominal:     General: There is no distension.     Tenderness: There is no abdominal tenderness. There is no rebound.  Musculoskeletal:        General: Normal range of motion.     Cervical back: Neck supple.  Lymphadenopathy:  Cervical: No cervical adenopathy.  Skin:    Findings: No erythema or rash.  Neurological:     Mental Status: She is oriented to person, place, and time.     Motor: No abnormal muscle tone.     Coordination: Coordination normal.  Psychiatric:        Behavior: Behavior normal.     ED Results / Procedures / Treatments   Labs (all labs ordered are listed, but only abnormal results are displayed) Labs Reviewed - No data to display  EKG None  Radiology No results found.  Procedures Procedures    Medications Ordered in ED Medications  metoCLOPramide (REGLAN) injection 10 mg (10 mg Intravenous Given 05/19/23 2046)  diphenhydrAMINE (BENADRYL) injection 25 mg (25 mg Intravenous Given 05/19/23 2045)  ketorolac  (TORADOL) 30 MG/ML injection 30 mg (30 mg Intravenous Given 05/19/23 2046)    ED Course/ Medical Decision Making/ A&P                             Medical Decision Making Risk Prescription drug management.   Patient with a bad headache that has been relieved by a migraine cocktail.  She will follow-up as needed with her PCP and is given Motrin for pain        Final Clinical Impression(s) / ED Diagnoses Final diagnoses:  Bad headache    Rx / DC Orders ED Discharge Orders          Ordered    ibuprofen (ADVIL) 800 MG tablet  Every 8 hours PRN        05/19/23 2249              Bethann Berkshire, MD 05/20/23 1435

## 2023-05-19 NOTE — Discharge Instructions (Signed)
Follow-up with your family doctor if any problems 

## 2023-05-19 NOTE — ED Triage Notes (Signed)
Pt states she has been having a headache for 3 days.  Some sensitivity towards light, denies sound sensitivity.  Denies N/V/D, cold s/s.  Reports some right ear pain as well.

## 2023-05-19 NOTE — ED Notes (Signed)
ED Provider at bedside. 

## 2023-05-27 ENCOUNTER — Inpatient Hospital Stay: Payer: BC Managed Care – PPO | Admitting: Family Medicine

## 2023-05-30 ENCOUNTER — Ambulatory Visit: Payer: BC Managed Care – PPO | Admitting: *Deleted

## 2023-05-30 ENCOUNTER — Encounter: Payer: Self-pay | Admitting: *Deleted

## 2023-05-30 VITALS — BP 117/68 | HR 80 | Ht 59.0 in | Wt 186.0 lb

## 2023-05-30 DIAGNOSIS — Z3201 Encounter for pregnancy test, result positive: Secondary | ICD-10-CM

## 2023-05-30 LAB — POCT URINE PREGNANCY: Preg Test, Ur: POSITIVE — AB

## 2023-05-30 NOTE — Progress Notes (Signed)
   NURSE VISIT- PREGNANCY CONFIRMATION   SUBJECTIVE:  Courtney Murray is a 32 y.o. G81P1011 female at [redacted]w[redacted]d by certain LMP of Patient's last menstrual period was 04/24/2023 (exact date). Here for pregnancy confirmation.  Home pregnancy test: positive x 4.   She reports no complaints.  She is taking prenatal vitamins.    OBJECTIVE:  BP 117/68 (BP Location: Left Arm, Patient Position: Sitting, Cuff Size: Normal)   Pulse 80   Ht 4\' 11"  (1.499 m)   Wt 186 lb (84.4 kg)   LMP 04/24/2023 (Exact Date)   BMI 37.57 kg/m   Appears well, in no apparent distress  Results for orders placed or performed in visit on 05/30/23 (from the past 24 hour(s))  POCT urine pregnancy   Collection Time: 05/30/23  3:20 PM  Result Value Ref Range   Preg Test, Ur Positive (A) Negative    ASSESSMENT: Positive pregnancy test, [redacted]w[redacted]d by LMP    PLAN: Schedule for dating ultrasound in 3 weeks Prenatal vitamins: continue   Nausea medicines: not currently needed   OB packet given: Yes  Malachy Mood  05/30/2023 3:30 PM

## 2023-06-02 ENCOUNTER — Emergency Department (HOSPITAL_COMMUNITY): Payer: BC Managed Care – PPO

## 2023-06-02 ENCOUNTER — Emergency Department (HOSPITAL_COMMUNITY)
Admission: EM | Admit: 2023-06-02 | Discharge: 2023-06-03 | Disposition: A | Discharge status: Home / Self Care | Payer: BC Managed Care – PPO | Attending: Emergency Medicine | Admitting: Emergency Medicine

## 2023-06-02 ENCOUNTER — Encounter (HOSPITAL_COMMUNITY): Payer: Self-pay | Admitting: Emergency Medicine

## 2023-06-02 ENCOUNTER — Other Ambulatory Visit: Payer: Self-pay

## 2023-06-02 DIAGNOSIS — R739 Hyperglycemia, unspecified: Secondary | ICD-10-CM | POA: Diagnosis not present

## 2023-06-02 DIAGNOSIS — Z3A01 Less than 8 weeks gestation of pregnancy: Secondary | ICD-10-CM | POA: Diagnosis not present

## 2023-06-02 DIAGNOSIS — O209 Hemorrhage in early pregnancy, unspecified: Secondary | ICD-10-CM | POA: Diagnosis not present

## 2023-06-02 DIAGNOSIS — O3680X0 Pregnancy with inconclusive fetal viability, not applicable or unspecified: Secondary | ICD-10-CM | POA: Diagnosis not present

## 2023-06-02 DIAGNOSIS — Z91010 Allergy to peanuts: Secondary | ICD-10-CM | POA: Insufficient documentation

## 2023-06-02 DIAGNOSIS — Z3A00 Weeks of gestation of pregnancy not specified: Secondary | ICD-10-CM | POA: Diagnosis not present

## 2023-06-02 DIAGNOSIS — R1030 Lower abdominal pain, unspecified: Secondary | ICD-10-CM | POA: Diagnosis not present

## 2023-06-02 LAB — CBC
HCT: 42.2 % (ref 36.0–46.0)
Hemoglobin: 13.4 g/dL (ref 12.0–15.0)
MCH: 25.8 pg — ABNORMAL LOW (ref 26.0–34.0)
MCHC: 31.8 g/dL (ref 30.0–36.0)
MCV: 81.2 fL (ref 80.0–100.0)
Platelets: 262 10*3/uL (ref 150–400)
RBC: 5.2 MIL/uL — ABNORMAL HIGH (ref 3.87–5.11)
RDW: 15 % (ref 11.5–15.5)
WBC: 10.5 10*3/uL (ref 4.0–10.5)
nRBC: 0 % (ref 0.0–0.2)

## 2023-06-02 LAB — WET PREP, GENITAL
Clue Cells Wet Prep HPF POC: NONE SEEN
Sperm: NONE SEEN
Trich, Wet Prep: NONE SEEN
WBC, Wet Prep HPF POC: <10 (ref <10)
Yeast Wet Prep HPF POC: NONE SEEN

## 2023-06-02 LAB — BASIC METABOLIC PANEL
Anion gap: 8 (ref 5–15)
BUN: 10 mg/dL (ref 6–20)
CO2: 23 mmol/L (ref 22–32)
Calcium: 8.8 mg/dL — ABNORMAL LOW (ref 8.9–10.3)
Chloride: 104 mmol/L (ref 98–111)
Creatinine, Ser: 0.89 mg/dL (ref 0.44–1.00)
GFR, Estimated: >60 mL/min (ref >60)
Glucose, Bld: 130 mg/dL — ABNORMAL HIGH (ref 70–99)
Potassium: 3.5 mmol/L (ref 3.5–5.1)
Sodium: 135 mmol/L (ref 135–145)

## 2023-06-02 LAB — URINALYSIS, ROUTINE W REFLEX MICROSCOPIC
Bilirubin Urine: NEGATIVE
Glucose, UA: NEGATIVE mg/dL
Ketones, ur: NEGATIVE mg/dL
Nitrite: NEGATIVE
Protein, ur: 30 mg/dL — AB
RBC / HPF: >50 RBC/hpf (ref 0–5)
Specific Gravity, Urine: 1.015 (ref 1.005–1.030)
pH: 7 (ref 5.0–8.0)

## 2023-06-02 LAB — ABO/RH: ABO/RH(D): A POS

## 2023-06-02 LAB — HCG, QUANTITATIVE, PREGNANCY: hCG, Beta Chain, Quant, S: 36 m[IU]/mL — ABNORMAL HIGH (ref <5)

## 2023-06-02 NOTE — ED Triage Notes (Addendum)
Pt in with low abdominal cramping since this afternoon, reports light spotting. States she is [redacted] wks pregnant, LMP 6/9

## 2023-06-02 NOTE — ED Provider Notes (Signed)
[place] EMERGENCY DEPARTMENT AT [facility] Provider Note   CSN: [id number] Arrival date & time: 06/02/23  2029     History  Chief Complaint  Patient presents with   [redacted] wks pregnant   Abdominal Cramping    Courtney Murray is a 32 y.o. female.who presents to the ED with a cc of vaginal bleeding. She is G3P1001. She reports being approximately [redacted] weeks pregnant.  Patient states that she began having some cramping in her lower abdomen followed by spotting and bleeding.  She denies vomiting or urinary symptoms.   Abdominal Cramping       Home Medications Prior to Admission medications   Medication Sig Start Date End Date Taking? Authorizing Provider  cetirizine (ZYRTEC ALLERGY) 10 MG tablet Take 1 tablet (10 mg total) by mouth daily. 02/11/22   [employer], Alvino Chapel, FNP  cyclobenzaprine (FLEXERIL) 10 MG tablet Take 10 mg by mouth 3 (three) times daily as needed. 04/04/23   [provider]  Prenatal Vit-Fe Fumarate-FA (PRENATAL VITAMIN PO) Take by mouth.    [provider]      Allergies    Almond (diagnostic) and Peanut-containing drug products    Review of Systems   Review of Systems  Physical Exam Updated Vital Signs BP 118/81   Pulse 71   Temp 98 F (36.7 C) (Oral)   Resp 16   Wt 84.8 kg   LMP 04/24/2023 (Exact Date)   SpO2 100%   BMI 37.77 kg/m  Physical Exam Vitals and nursing note reviewed.  Constitutional:      General: She is not in acute distress.    Appearance: She is well-developed. She is not diaphoretic.  HENT:     Head: Normocephalic and atraumatic.     Right Ear: External ear normal.     Left Ear: External ear normal.     Nose: Nose normal.     Mouth/Throat:     Mouth: Mucous membranes are moist.  Eyes:     General: No scleral icterus.    Conjunctiva/sclera: Conjunctivae normal.  Cardiovascular:     Rate and Rhythm: Normal rate and regular rhythm.     Heart sounds: Normal heart sounds. No murmur heard.    No  friction rub. No gallop.  Pulmonary:     Effort: Pulmonary effort is normal. No respiratory distress.     Breath sounds: Normal breath sounds.  Abdominal:     General: Bowel sounds are normal. There is no distension.     Palpations: Abdomen is soft. There is no mass.     Tenderness: There is no abdominal tenderness. There is no guarding.  Genitourinary:    Comments: Normal external female genitalia.  Obvious bleeding from the vagina.  There is blood in the vaginal canal.  To parous os with mucoid and bloody discharge.  No products of conception in the os.  No other significant discharge. Musculoskeletal:     Cervical back: Normal range of motion.  Skin:    General: Skin is warm and dry.  Neurological:     Mental Status: She is alert and oriented to person, place, and time.  Psychiatric:        Behavior: Behavior normal.     ED Results / Procedures / Treatments   Labs (all labs ordered are listed, but only abnormal results are displayed) Labs Reviewed  URINALYSIS, ROUTINE W REFLEX MICROSCOPIC - Abnormal; Notable for the following components:      Result Value  APPearance HAZY (*)    Hgb urine dipstick LARGE (*)    Protein, ur 30 (*)    Leukocytes,Ua TRACE (*)    Bacteria, UA RARE (*)    All other components within normal limits  HCG, QUANTITATIVE, PREGNANCY - Abnormal; Notable for the following components:   hCG, Beta Chain, Quant, S 36 (*)    All other components within normal limits  CBC - Abnormal; Notable for the following components:   RBC 5.20 (*)    MCH 25.8 (*)    All other components within normal limits  BASIC METABOLIC PANEL - Abnormal; Notable for the following components:   Glucose, Bld 130 (*)    Calcium 8.8 (*)    All other components within normal limits  WET PREP, GENITAL  RPR  GC/CHLAMYDIA PROBE AMP ([place]) NOT AT [facility]    EKG None  Radiology No results found.  Procedures Procedures    Medications Ordered in ED Medications - No data  to display  ED Course/ Medical Decision Making/ A&P Clinical Course as of 06/03/23 1812  Thu Jun 02, 2023  2223 hCG, quantitative, pregnancy(!) [AH]  2304 Urinalysis, Routine w reflex microscopic -Urine, Clean Catch(!) [AH]    Clinical Course User Index [AH] Arthor Captain, PA-C                             Medical Decision Making Amount and/or Complexity of Data Reviewed Labs: ordered. Decision-making details documented in ED Course. Radiology: ordered.   Patient here with complaint of bleeding in early pregnancy., The differential diagnosis for vaginal bleeding in pregnancy less than 20 weeks includes but is not limited to the following: Ectopic pregnancy, Subchorionic hematoma, First Trimester Abortion, Gestational trophoblastic disease, Heterotopic pregnancy, Implantation bleeding, Molar pregnancy, Cervicitis, Fibroids, Vaginal Trauma Patient's urine may be contaminated however if sent this for urine culture. Patient's hCG notably 36.  This is extraordinarily low for potential gestational age and likely representative of miscarriage.  CBC and BMP otherwise without significant abnormalities for mild hyperglycemia.  ABO Rh pending.  Patient's OB ultrasound is also currently pending.  Case discussed with Dr. Oletta Cohn at shift handoff.  He will follow-up on imaging and report recommendations for the patient.        Final Clinical Impression(s) / ED Diagnoses Final diagnoses:  None    Rx / DC Orders ED Discharge Orders     None         Arthor Captain, PA-C 06/03/23 1814    Linwood Dibbles, MD 06/06/23 [phone]

## 2023-06-03 DIAGNOSIS — Z3A00 Weeks of gestation of pregnancy not specified: Secondary | ICD-10-CM | POA: Diagnosis not present

## 2023-06-03 DIAGNOSIS — O3680X0 Pregnancy with inconclusive fetal viability, not applicable or unspecified: Secondary | ICD-10-CM | POA: Diagnosis not present

## 2023-06-03 DIAGNOSIS — O209 Hemorrhage in early pregnancy, unspecified: Secondary | ICD-10-CM | POA: Diagnosis not present

## 2023-06-03 NOTE — ED Notes (Signed)
Patient verbalizes understanding of discharge instructions. Opportunity for questioning and answers were provided. Armband removed by staff, pt discharged from ED. Ambulated out to lobby  

## 2023-06-03 NOTE — ED Provider Notes (Signed)
Assumed care of patient from Arthor Captain, PA-C and Linwood Dibbles, MD at shift change.  Patient seen with abdominal cramping, bleeding in early pregnancy.  Patient thought to be [redacted] weeks pregnant.  I did check on the patient.  She still having slight pain.  Unfortunately, there is a significant outage in the computer system and results of the ultrasound are not forthcoming.  I discussed with the patient the possibility of ectopic, although with her beta-hCG of 36 it is not felt that the ultrasound will be diagnostic.  Offered to continue monitoring her while awaiting read on ultrasound but she would like to go home.  Discussed with her that if she has further problems she should go to MAU at [facility].  She will call OB/GYN in the morning to schedule follow-up.  I will monitor ultrasound and if there are concerning findings, will contact her at home.   Gilda Crease, MD 06/03/23 [phone]

## 2023-06-03 NOTE — ED Notes (Signed)
This tech at bedside for internal portion of pelvic U.S

## 2023-06-03 NOTE — Discharge Instructions (Signed)
Rest, plenty of fluids.  Tylenol for pain.  Call OB/GYN in the morning for an appointment for as soon as possible.  If you have further problems, return to the ED or if possible, go to [facility].

## 2023-06-04 LAB — RPR: RPR Ser Ql: NONREACTIVE — AB

## 2023-06-06 ENCOUNTER — Other Ambulatory Visit: Payer: BC Managed Care – PPO

## 2023-06-06 DIAGNOSIS — O3680X0 Pregnancy with inconclusive fetal viability, not applicable or unspecified: Secondary | ICD-10-CM

## 2023-06-06 LAB — GC/CHLAMYDIA PROBE AMP (~~LOC~~) NOT AT ARMC
Chlamydia: NEGATIVE
Comment: NEGATIVE
Comment: NORMAL
Neisseria Gonorrhea: NEGATIVE

## 2023-06-07 LAB — BETA HCG QUANT (REF LAB): hCG Quant: 2 m[IU]/mL

## 2023-06-24 ENCOUNTER — Other Ambulatory Visit: Payer: BC Managed Care – PPO

## 2023-08-22 ENCOUNTER — Ambulatory Visit (INDEPENDENT_AMBULATORY_CARE_PROVIDER_SITE_OTHER): Payer: BC Managed Care – PPO

## 2023-08-22 VITALS — BP 120/70 | HR 67 | Ht 59.0 in | Wt 185.2 lb

## 2023-08-22 DIAGNOSIS — Z3201 Encounter for pregnancy test, result positive: Secondary | ICD-10-CM | POA: Diagnosis not present

## 2023-08-22 LAB — POCT URINE PREGNANCY: Preg Test, Ur: POSITIVE — AB

## 2023-08-22 NOTE — Progress Notes (Signed)
   NURSE VISIT- PREGNANCY CONFIRMATION   SUBJECTIVE:  Courtney Murray is a 32 y.o. G48P1011 female at [redacted]w[redacted]d by certain LMP of Patient's last menstrual period was 07/20/2023 (exact date). Here for pregnancy confirmation.  Home pregnancy test: positive x 2   She reports  tender breasts .  She is not taking prenatal vitamins.    OBJECTIVE:  BP 120/70 (BP Location: Right Arm, Patient Position: Sitting, Cuff Size: Normal)   Pulse 67   Ht 4\' 11"  (1.499 m)   Wt 185 lb 3.2 oz (84 kg)   LMP 07/20/2023 (Exact Date)   BMI 37.41 kg/m   Appears well, in no apparent distress  Results for orders placed or performed in visit on 08/22/23 (from the past 24 hour(s))  POCT urine pregnancy   Collection Time: 08/22/23  3:52 PM  Result Value Ref Range   Preg Test, Ur Positive (A) Negative    ASSESSMENT: Positive pregnancy test, [redacted]w[redacted]d by LMP    PLAN: Schedule for dating ultrasound in 3 weeks Prenatal vitamins: plans to begin OTC ASAP   Nausea medicines: not currently needed   OB packet given: Yes  Caralyn Guile  08/22/2023 3:54 PM

## 2023-09-08 ENCOUNTER — Other Ambulatory Visit: Payer: Self-pay | Admitting: Obstetrics & Gynecology

## 2023-09-08 DIAGNOSIS — O3680X0 Pregnancy with inconclusive fetal viability, not applicable or unspecified: Secondary | ICD-10-CM

## 2023-09-12 ENCOUNTER — Ambulatory Visit (INDEPENDENT_AMBULATORY_CARE_PROVIDER_SITE_OTHER): Payer: BC Managed Care – PPO | Admitting: Radiology

## 2023-09-12 ENCOUNTER — Other Ambulatory Visit: Payer: BC Managed Care – PPO

## 2023-09-12 ENCOUNTER — Other Ambulatory Visit: Payer: Self-pay | Admitting: Obstetrics & Gynecology

## 2023-09-12 DIAGNOSIS — O3680X0 Pregnancy with inconclusive fetal viability, not applicable or unspecified: Secondary | ICD-10-CM

## 2023-09-12 DIAGNOSIS — Z8759 Personal history of other complications of pregnancy, childbirth and the puerperium: Secondary | ICD-10-CM | POA: Diagnosis not present

## 2023-09-12 DIAGNOSIS — Z3A01 Less than 8 weeks gestation of pregnancy: Secondary | ICD-10-CM

## 2023-09-12 DIAGNOSIS — Z3490 Encounter for supervision of normal pregnancy, unspecified, unspecified trimester: Secondary | ICD-10-CM

## 2023-09-12 NOTE — Progress Notes (Signed)
[place] 7+5 weeks by LMP (07-20-23) Hx of recent SAB in July '24  Today Gest Sac = 10 mm = 5+5 weeks  No yolk Sac seen  faint avascular internal echoes seen 2 weeks < dates    -  Needs F/U   ? Early MAB Single small intramural fibroid mid right ut wall = 12 x 11 mm Normal Rt Ov  -  Left Ov not seen  -  Neg adnexal regions Neg CDS - no free fluid present

## 2023-09-13 ENCOUNTER — Other Ambulatory Visit: Payer: Self-pay | Admitting: Adult Health

## 2023-09-13 DIAGNOSIS — Z3201 Encounter for pregnancy test, result positive: Secondary | ICD-10-CM

## 2023-09-13 LAB — BETA HCG QUANT (REF LAB): hCG Quant: 7227 m[IU]/mL

## 2023-09-15 LAB — BETA HCG QUANT (REF LAB): hCG Quant: 7629 m[IU]/mL

## 2023-09-19 ENCOUNTER — Other Ambulatory Visit: Payer: Self-pay | Admitting: Adult Health

## 2023-09-19 DIAGNOSIS — Z3201 Encounter for pregnancy test, result positive: Secondary | ICD-10-CM

## 2023-09-19 DIAGNOSIS — O209 Hemorrhage in early pregnancy, unspecified: Secondary | ICD-10-CM

## 2023-09-19 NOTE — Progress Notes (Signed)
Ck QHCG  

## 2023-09-20 ENCOUNTER — Other Ambulatory Visit: Payer: Self-pay | Admitting: Adult Health

## 2023-09-20 ENCOUNTER — Other Ambulatory Visit: Payer: Self-pay | Admitting: Obstetrics & Gynecology

## 2023-09-20 DIAGNOSIS — O209 Hemorrhage in early pregnancy, unspecified: Secondary | ICD-10-CM

## 2023-09-20 DIAGNOSIS — Z3201 Encounter for pregnancy test, result positive: Secondary | ICD-10-CM

## 2023-09-20 DIAGNOSIS — O3680X0 Pregnancy with inconclusive fetal viability, not applicable or unspecified: Secondary | ICD-10-CM

## 2023-09-20 LAB — BETA HCG QUANT (REF LAB): hCG Quant: 9733 m[IU]/mL

## 2023-09-21 ENCOUNTER — Ambulatory Visit (INDEPENDENT_AMBULATORY_CARE_PROVIDER_SITE_OTHER): Payer: BC Managed Care – PPO

## 2023-09-21 ENCOUNTER — Ambulatory Visit: Payer: BC Managed Care – PPO | Admitting: Obstetrics & Gynecology

## 2023-09-21 ENCOUNTER — Encounter: Payer: Self-pay | Admitting: Obstetrics & Gynecology

## 2023-09-21 ENCOUNTER — Other Ambulatory Visit: Payer: Self-pay | Admitting: Obstetrics & Gynecology

## 2023-09-21 VITALS — BP 120/77 | HR 73

## 2023-09-21 DIAGNOSIS — O4690 Antepartum hemorrhage, unspecified, unspecified trimester: Secondary | ICD-10-CM | POA: Diagnosis not present

## 2023-09-21 DIAGNOSIS — Z3A01 Less than 8 weeks gestation of pregnancy: Secondary | ICD-10-CM | POA: Diagnosis not present

## 2023-09-21 DIAGNOSIS — O039 Complete or unspecified spontaneous abortion without complication: Secondary | ICD-10-CM

## 2023-09-21 DIAGNOSIS — O3680X0 Pregnancy with inconclusive fetal viability, not applicable or unspecified: Secondary | ICD-10-CM

## 2023-09-21 DIAGNOSIS — Z3A09 9 weeks gestation of pregnancy: Secondary | ICD-10-CM

## 2023-09-21 NOTE — Progress Notes (Signed)
   GYN VISIT Patient name: Courtney Murray MRN [id number]  Date of birth: [birth year] Chief Complaint:   Follow-up  History of Present Illness:   Courtney Murray is a 32 y.o. G29P1011  female being seen today for early pregnancy vs miscarriage.  Pt notes that she started heavy bleeding and cramping and does note considerable bleeding with passage of tissue yesterday.  She is still having some bleeding now but it has started to slow down.  She denies fever or chill.  She reports no other acute complaints.     Patient's last menstrual period was 07/20/2023 (exact date).    Review of Systems:   Pertinent items are noted in HPI Denies fever/chills, dizziness, headaches, visual disturbances, fatigue, shortness of breath, chest pain, abdominal pain, vomiting, no problems with periods, bowel movements, urination, or intercourse unless otherwise stated above.  Pertinent History Reviewed:   Past Surgical History:  Procedure Laterality Date   NO PAST SURGERIES      Past Medical History:  Diagnosis Date   Chlamydia    Contraceptive management 05/09/2014   Gestational diabetes    Gestational diabetes mellitus, antepartum    Gonorrhea in female    Nexplanon in place 05/19/2016   Pregnant 11/25/2014   Vaginal itching 08/27/2014   Yeast infection of the vagina 08/27/2014   Reviewed problem list, medications and allergies. Physical Assessment:   Vitals:   09/21/23 1623  BP: 120/77  Pulse: 73  There is no height or weight on file to calculate BMI.       Physical Examination:   General appearance: alert, well appearing, and in no distress  Psych: mood appropriate, normal affect  Skin: warm & dry   Cardiovascular: normal heart rate noted  Respiratory: normal respiratory effort, no distress  Abdomen: soft, non-tender   Pelvic: examination not indicated  Extremities: no edema   Chaperone: N/A    [place] today: no IUP visualized,EEC 22 mm,avascular endometrium,normal ovaries,posterior  intramural fibroid 1.2 x 1.1 x .7 cm   Assessment & Plan:  1) Miscarriage -US findings confirm miscarriage, likely complete -will plan to follow HCG level, recheck in 1week -blood type A pos, RhoGAM not indicated -reviewed concerns about future pregnancy, pelvic rest and all concerns []  plan for follow up in march, consider R[facility] lab work at that time   Orders Placed This Encounter  Procedures   Beta hCG quant (ref lab)    Return for March 2024 and lab visit next Wed.   Myna Hidalgo, DO Attending Obstetrician & Gynecologist, [facility] for [employer], [facility]

## 2023-09-21 NOTE — Progress Notes (Signed)
US TV: no IUP visualized,EEC 22 mm,avascular endometrium,normal ovaries,posterior intramural fibroid 1.2 x 1.1 x .7 cm

## 2023-09-22 LAB — BETA HCG QUANT (REF LAB): hCG Quant: 3095 m[IU]/mL

## 2023-09-29 LAB — BETA HCG QUANT (REF LAB): hCG Quant: 52 m[IU]/mL

## 2024-01-17 ENCOUNTER — Ambulatory Visit: Admitting: Nurse Practitioner

## 2024-01-17 VITALS — BP 127/88 | HR 84 | Temp 98.2°F | Ht 59.0 in | Wt 186.6 lb

## 2024-01-17 DIAGNOSIS — M62838 Other muscle spasm: Secondary | ICD-10-CM | POA: Diagnosis not present

## 2024-01-17 DIAGNOSIS — M5441 Lumbago with sciatica, right side: Secondary | ICD-10-CM

## 2024-01-17 MED ORDER — TIZANIDINE HCL 4 MG PO TABS: 4.0000 mg | ORAL_TABLET | Freq: Four times a day (QID) | ORAL | 0 refills | Status: DC | PRN

## 2024-01-17 MED ORDER — MELOXICAM 15 MG PO TABS: 15.0000 mg | ORAL_TABLET | Freq: Every day | ORAL | 0 refills | Status: DC

## 2024-01-17 NOTE — Progress Notes (Unsigned)
 Subjective:    Patient ID: Courtney Murray, female    DOB: [birth year], 33 y.o.   MRN: [id number]  HPI Courtney Murray presents today complaining of lower back pain. Has a history of sciatica with lower back pain. Takes flexeril for muscle spasms. Has muscle spasms intermittently about once a month. Injured her back yesterday reaching for her purse, and felt a popping sensation in her left mid back. Since her injury, she has also had constant aching lower back pain that shoots down her right leg. Pain is aggravated by walking and bending over. Also endorses right neck pain when turning her head to drive. Endorses numbness and tingling in her right foot at times but no difficulty with gait. Has tried her prescribed flexeril, but says it makes her super drowsy and does not improve symptoms. Has tried robaxin in the past with no improvement. Tried steroids in the past, and had heart palpitations while taking the medication.   Review of Systems  Respiratory:  Negative for cough, chest tightness, shortness of breath and wheezing.   Cardiovascular:  Negative for chest pain.  Musculoskeletal:        Positive for lower back pain that radiates down her buttocks, the lateral aspect of her leg and calf, and down to her toes.   Neurological:  Positive for numbness. Negative for weakness.  Psychiatric/Behavioral:  Positive for sleep disturbance.       Objective:   Physical Exam Cardiovascular:     Rate and Rhythm: Normal rate and regular rhythm.     Heart sounds: Normal heart sounds, S1 normal and S2 normal. No murmur heard. Pulmonary:     Effort: Pulmonary effort is normal. No respiratory distress.     Breath sounds: Normal breath sounds.  Musculoskeletal:     Lumbar back: Negative right straight leg raise test and negative left straight leg raise test.     Comments: Gait antalgic, movements slow due to pain.  Cervical spine/neck: No redness, swelling, or ecchymosis noted on inspection. Muscle tightness  noted on the right trapezius muscle. No direct or paraspinous tenderness noted with palpation. Full ROM of cervical spine with pain elicited.  Thoracic spice/mid-back: No redness, swelling, or ecchymosis noted on inspection. Tenderness noted on the left paraspinous area. No direct spine tenderness noted. Full ROM of thoracic spine with pain elicited when rotating to the left.  Lumbar spine/lower back: No redness, swelling, or ecchymosis noted on inspection. Tenderness noted on the right and left paraspinous areas. No direct spine tenderness noted. Full ROM of lumber spine with pain elicited mainly when rotating to the left. Can perform heel and toe walking with no issues.   Psychiatric:        Mood and Affect: Mood normal.        Behavior: Behavior normal.        Thought Content: Thought content normal.        Judgment: Judgment normal.    Vitals:   01/17/24 1513  BP: 127/88  Pulse: 84  Temp: 98.2 F (36.8 C)  Height: 4\' 11"  (1.499 m)  Weight: 186 lb 9.6 oz (84.6 kg)  SpO2: 100%  BMI (Calculated): 37.67       Assessment & Plan:  1. Bilateral low back pain with right-sided sciatica, unspecified chronicity (Primary)  - meloxicam (MOBIC) 15 MG tablet; Take 1 tablet (15 mg total) by mouth daily. PRN pain.  Dispense: 30 tablet; Refill: 0 -Advised to not take ibuprofen while taking meloxicam.  -Educated patient about  alternating topical ice and heat therapy, use of a TENS unit, massage therapy and use of lidocaine patches. Provided education in AVS.   2. Muscle spasms of neck  - tiZANidine (ZANAFLEX) 4 MG tablet; Take 1 tablet (4 mg total) by mouth every 6 (six) hours as needed for muscle spasms.  Dispense: 30 tablet; Refill: 0  -Advised to discontinue Flexeril while taking this medication.  Return if symptoms worsen or fail to improve.   I have seen and examined this patient alongside the NP student. I have reviewed and verified the student note and agree with the assessment and plan.   Sherie Don, FNP

## 2024-01-17 NOTE — Patient Instructions (Addendum)
 Lidocaine patches, ice/heat, TENS unit  Lumbar Sprain A lumbar sprain, which is sometimes called a low-back sprain, is a stretch or tear in the ligaments in the lower back (lumbar spine). Ligaments are the bands of tissue that connect bones to each other. This type of injury occurs when you stretch the ligaments beyond their limits. Lumbar sprains can range from mild to severe. Mild sprains may involve stretching a ligament without tearing it. These may heal in 1-2 weeks. More severe sprains involve tearing of the ligament. These will cause more pain and may take 6-8 weeks to heal. What are the causes? This condition may be caused by: Trauma, such as a fall or a hit to the body. Twisting or overstretching the back. This may result from doing activities that take a lot of energy, such as lifting heavy objects. What increases the risk? A lumbar sprain is more common in: Athletes. People with obesity. People who do repeated lifting, bending, or other movements that involve their back. What are the signs or symptoms? Symptoms of this condition may include: Sharp or dull pain in the lower back that does not go away. The pain may spread to the buttocks. Stiffness or limited range of motion. Sudden muscle tightening (spasms). How is this diagnosed? This condition may be diagnosed based on: Your symptoms. Your medical history. A physical exam. Imaging tests, such as: X-rays. MRI. How is this treated? Treatment for this condition may include: Resting the injured area. Applying heat and cold to the affected area. Over-the-counter medicines for pain and inflammation, such as NSAIDs. Prescription medicine for pain or to relax the muscles. These may be needed for a short time. Physical therapy exercises to improve movement and strength. Follow these instructions at home: Managing pain, stiffness, and swelling     If told, put ice on the injured area during the first 24 hours after your  injury. Put ice in a plastic bag. Place a towel between your skin and the bag. Leave the ice on for 20 minutes, 2-3 times a day. If told, apply heat to the affected area as often as told by your health care provider. Use the heat source that your health care provider recommends, such as a moist heat pack or a heating pad. Place a towel between your skin and the heat source. Leave the heat on for 20-30 minutes. If your skin turns bright red, remove the ice or heat right away to prevent skin damage. The risk of damage is higher if you cannot feel pain, heat, or cold. Activity Rest and return to your normal activities as told by your health care provider. Ask your health care provider what activities are safe for you. Do exercises as told by your health care provider. General instructions Take over-the-counter and prescription medicines only as told by your health care provider. Ask your health care provider if the medicine prescribed to you: Requires you to avoid driving or using machinery. Can cause constipation. You may need to take these actions to prevent or treat constipation: Drink enough fluid to keep your urine pale yellow. Take over-the-counter or prescription medicines. Eat foods that are high in fiber, such as beans, whole grains, and fresh fruits and vegetables. Limit foods that are high in fat and processed sugars, such as fried or sweet foods. Do not use any products that contain nicotine or tobacco. These products include cigarettes, chewing tobacco, and vaping devices, such as e-cigarettes. If you need help quitting, ask your health care provider.  How is this prevented? To prevent a future low-back injury: Always warm up properly before physical activity or sports. Cool down and stretch after being active. Use correct form when playing sports and lifting heavy objects. Bend your knees before you lift heavy objects. Use good posture when sitting and standing. Stay physically  fit and keep a healthy weight. Do at least 150 minutes of moderate-intensity exercise each week, such as brisk walking or water aerobics. Do strength exercises at least 2 times each week. Contact a health care provider if: Your back pain does not improve after several weeks of treatment. Your symptoms get worse. You have a fever. Get help right away if: Your back pain is severe. You are unable to stand or walk. You develop pain in your legs. You have weakness in your buttocks or legs. You have trouble controlling when you urinate or when you have a bowel movement. You have frequent, painful, or bloody urination. This information is not intended to replace advice given to you by your health care provider. Make sure you discuss any questions you have with your health care provider. Document Revised: 05/25/2022 Document Reviewed: 05/25/2022 Elsevier Patient Education  2024 [employer].

## 2024-01-19 ENCOUNTER — Encounter: Payer: Self-pay | Admitting: Nurse Practitioner

## 2024-01-23 ENCOUNTER — Telehealth: Payer: Self-pay

## 2024-01-23 NOTE — Telephone Encounter (Signed)
 Prescription Request  01/23/2024  LOV: Visit date not found  What is the name of the medication or equipment? tiZANidine (ZANAFLEX) 4 MG tablet   Have you contacted your pharmacy to request a refill? Yes   Which pharmacy would you like this sent to?  [id number] - [place],  - [address] [place] [address] Phone: [phone] Fax: [phone]    Patient notified that their request is being sent to the clinical staff for review and that they should receive a response within 2 business days.   Please advise at Mobile [phone] (mobile)

## 2024-01-25 ENCOUNTER — Other Ambulatory Visit: Payer: Self-pay | Admitting: Nurse Practitioner

## 2024-01-25 DIAGNOSIS — M62838 Other muscle spasm: Secondary | ICD-10-CM

## 2024-01-25 MED ORDER — TIZANIDINE HCL 4 MG PO TABS: 4.0000 mg | ORAL_TABLET | Freq: Four times a day (QID) | ORAL | 0 refills | Status: DC | PRN

## 2024-09-14 ENCOUNTER — Ambulatory Visit: Admitting: Adult Health

## 2024-09-14 ENCOUNTER — Encounter: Payer: Self-pay | Admitting: Adult Health

## 2024-09-14 VITALS — BP 111/79 | HR 71 | Ht 59.0 in | Wt 185.0 lb

## 2024-09-14 DIAGNOSIS — Z01419 Encounter for gynecological examination (general) (routine) without abnormal findings: Secondary | ICD-10-CM

## 2024-09-14 DIAGNOSIS — Z1331 Encounter for screening for depression: Secondary | ICD-10-CM

## 2024-09-14 DIAGNOSIS — Z13220 Encounter for screening for lipoid disorders: Secondary | ICD-10-CM | POA: Diagnosis not present

## 2024-09-14 DIAGNOSIS — Z131 Encounter for screening for diabetes mellitus: Secondary | ICD-10-CM

## 2024-09-14 NOTE — Progress Notes (Signed)
 Patient ID: Courtney Murray, female   DOB: [birth year], 33 y.o.   MRN: [id number] History of Present Illness: Courtney Murray is a 33 year old black female, with SO, [id number] in for a well woman gyn exam.     Component Value Date/Time   DIAGPAP  03/26/2022 1143    - Negative for intraepithelial lesion or malignancy (NILM)   DIAGPAP  05/24/2019 0000    NEGATIVE FOR INTRAEPITHELIAL LESIONS OR MALIGNANCY.   HPVHIGH Negative 03/26/2022 1143   ADEQPAP  03/26/2022 1143    Satisfactory for evaluation; transformation zone component PRESENT.   ADEQPAP  05/24/2019 0000    Satisfactory for evaluation  endocervical/transformation zone component PRESENT.    PCP is Dr Glendia Fielding  Current Medications, Allergies, Past Medical History, Past Surgical History, Family History and Social History were reviewed in [employer] electronic medical record.     Review of Systems: Patient denies any headaches, hearing loss, fatigue, blurred vision, shortness of breath, chest pain, abdominal pain, problems with bowel movements, urination, or intercourse. No joint pain or mood swings.  Has muscle spasms in hip and leg at times Sex drive is not as good as it used to be   Physical Exam:BP 111/79 (BP Location: Left Arm, Patient Position: Sitting, Cuff Size: Normal)   Pulse 71   Ht 4' 11 (1.499 m)   Wt 185 lb (83.9 kg)   LMP 09/06/2024 (Exact Date)   Breastfeeding No   BMI 37.37 kg/m   General:  Well developed, well nourished, no acute distress Skin:  Warm and dry Neck:  Midline trachea, normal thyroid, good ROM, no lymphadenopathy Lungs; Clear to auscultation bilaterally Breast:  No dominant palpable mass, retraction, or nipple discharge Cardiovascular: Regular rate and rhythm Abdomen:  Soft, non tender, no hepatosplenomegaly Pelvic:  External genitalia is normal in appearance, no lesions.  The vagina is normal in appearance. Urethra has no lesions or masses. The cervix is smooth.  Uterus is felt to be normal  size, shape, and contour.  No adnexal masses or tenderness noted.Bladder is non tender, no masses felt. Extremities/musculoskeletal:  No swelling or varicosities noted, no clubbing or cyanosis Psych:  No mood changes, alert and cooperative,seems happy AA is 2 Fall risk is low    09/14/2024    8:40 AM 01/17/2024    3:20 PM 05/11/2023   11:32 AM  Depression screen PHQ 2/9  Decreased Interest 0 0 0  Down, Depressed, Hopeless 0 0 0  PHQ - 2 Score 0 0 0  Altered sleeping 0 0   Tired, decreased energy 0 0   Change in appetite 0 0   Feeling bad or failure about yourself  0 0   Trouble concentrating 0 0   Moving slowly or fidgety/restless 0 0   Suicidal thoughts 0 0   PHQ-9 Score 0 0        09/14/2024    8:40 AM 01/17/2024    3:21 PM 05/11/2023   11:32 AM 04/18/2023    2:32 PM  GAD 7 : Generalized Anxiety Score  Nervous, Anxious, on Edge 0 0 0 0  Control/stop worrying 0 0 0 0  Worry too much - different things 0 0 0 0  Trouble relaxing 0 0 0 0  Restless 0 0 0 0  Easily annoyed or irritable 0 0 0 0  Afraid - awful might happen 0 0 0 0  Total GAD 7 Score 0 0 0 0    Upstream - 09/14/24 9161  Pregnancy Intention Screening   Does the patient want to become pregnant in the next year? Unsure    Does the patient's partner want to become pregnant in the next year? Unsure    Would the patient like to discuss contraceptive options today? No      Contraception Wrap Up   Current Method No Method - Other Reason    End Method No Method - Other Reason    Contraception Counseling Provided No           Examination chaperoned by Clarita Salt LPN   Impression and plan: 1. Encounter for well woman exam with routine gynecological exam (Primary) Pap and physical in 1 year Will check labs today - CBC - Comprehensive metabolic panel with GFR - Lipid panel Take OTC PNV with folic acid  2. Screening cholesterol level - Lipid panel  3. Screening for diabetes mellitus She had  gestational diabetes will check A1c - Hemoglobin A1c

## 2024-09-15 LAB — CBC
Hematocrit: 45.5 % (ref 34.0–46.6)
Hemoglobin: 14.4 g/dL (ref 11.1–15.9)
MCH: 25.4 pg — ABNORMAL LOW (ref 26.6–33.0)
MCHC: 31.6 g/dL (ref 31.5–35.7)
MCV: 80 fL (ref 79–97)
Platelets: 308 x10E3/uL (ref 150–450)
RBC: 5.67 x10E6/uL — ABNORMAL HIGH (ref 3.77–5.28)
RDW: 14 % (ref 11.7–15.4)
WBC: 7.1 x10E3/uL (ref 3.4–10.8)

## 2024-09-15 LAB — HEMOGLOBIN A1C
Est. average glucose Bld gHb Est-mCnc: 123 mg/dL
Hgb A1c MFr Bld: 5.9 % — ABNORMAL HIGH (ref 4.8–5.6)

## 2024-09-15 LAB — COMPREHENSIVE METABOLIC PANEL WITH GFR
ALT: 16 IU/L (ref 0–32)
AST: 18 IU/L (ref 0–40)
Albumin: 4.3 g/dL (ref 3.9–4.9)
Alkaline Phosphatase: 107 IU/L (ref 41–116)
BUN/Creatinine Ratio: 11 (ref 9–23)
BUN: 11 mg/dL (ref 6–20)
Bilirubin Total: 0.4 mg/dL (ref 0.0–1.2)
CO2: 24 mmol/L (ref 20–29)
Calcium: 9.6 mg/dL (ref 8.7–10.2)
Chloride: 103 mmol/L (ref 96–106)
Creatinine, Ser: 0.99 mg/dL (ref 0.57–1.00)
Globulin, Total: 3 g/dL (ref 1.5–4.5)
Glucose: 102 mg/dL — ABNORMAL HIGH (ref 70–99)
Potassium: 5 mmol/L (ref 3.5–5.2)
Sodium: 141 mmol/L (ref 134–144)
Total Protein: 7.3 g/dL (ref 6.0–8.5)
eGFR: 77 mL/min/1.73 (ref >59)

## 2024-09-15 LAB — LIPID PANEL
Chol/HDL Ratio: 2.8 ratio (ref 0.0–4.4)
Cholesterol, Total: 170 mg/dL (ref 100–199)
HDL: 60 mg/dL (ref >39)
LDL Chol Calc (NIH): 95 mg/dL (ref 0–99)
Triglycerides: 81 mg/dL (ref 0–149)
VLDL Cholesterol Cal: 15 mg/dL (ref 5–40)

## 2024-09-17 ENCOUNTER — Ambulatory Visit: Payer: Self-pay | Admitting: Adult Health

## 2024-10-03 ENCOUNTER — Encounter: Payer: Self-pay | Admitting: Nurse Practitioner

## 2024-10-04 ENCOUNTER — Other Ambulatory Visit: Payer: Self-pay

## 2024-10-04 ENCOUNTER — Encounter (HOSPITAL_COMMUNITY): Payer: Self-pay

## 2024-10-04 ENCOUNTER — Ambulatory Visit: Admitting: Family Medicine

## 2024-10-04 ENCOUNTER — Emergency Department (HOSPITAL_COMMUNITY)
Admission: EM | Admit: 2024-10-04 | Discharge: 2024-10-04 | Disposition: A | Discharge status: Home / Self Care | Attending: Emergency Medicine | Admitting: Emergency Medicine

## 2024-10-04 DIAGNOSIS — R319 Hematuria, unspecified: Secondary | ICD-10-CM | POA: Diagnosis present

## 2024-10-04 DIAGNOSIS — N390 Urinary tract infection, site not specified: Secondary | ICD-10-CM | POA: Diagnosis not present

## 2024-10-04 DIAGNOSIS — Z91010 Allergy to peanuts: Secondary | ICD-10-CM | POA: Insufficient documentation

## 2024-10-04 LAB — URINALYSIS, ROUTINE W REFLEX MICROSCOPIC
Bacteria, UA: NONE SEEN
Bilirubin Urine: NEGATIVE
Glucose, UA: NEGATIVE mg/dL
Ketones, ur: NEGATIVE mg/dL
Nitrite: POSITIVE — AB
Protein, ur: NEGATIVE mg/dL
Specific Gravity, Urine: 1.003 — ABNORMAL LOW (ref 1.005–1.030)
WBC, UA: >50 WBC/hpf (ref 0–5)
pH: 7 (ref 5.0–8.0)

## 2024-10-04 LAB — PREGNANCY, URINE: Preg Test, Ur: NEGATIVE

## 2024-10-04 MED ORDER — CEPHALEXIN 500 MG PO CAPS
500.0000 mg | ORAL_CAPSULE | Freq: Once | ORAL | Status: AC
  Administered 2024-10-04: 500 mg via ORAL
  Filled 2024-10-04: qty 1

## 2024-10-04 MED ORDER — CEPHALEXIN 500 MG PO CAPS: 500.0000 mg | ORAL_CAPSULE | Freq: Two times a day (BID) | ORAL | 0 refills | Status: AC

## 2024-10-04 NOTE — Discharge Instructions (Signed)
 You do have a urinary tract infection, take your next dose of the antibiotic this evening.  You may continue using your over-the-counter Pyridium to help with symptom relief.  Make sure you are drinking plenty of fluids.  Get rechecked for any worsening symptoms, development of nausea or fever, which can be signs of worsening infection.

## 2024-10-04 NOTE — ED Triage Notes (Signed)
 Pt arrived via POV c/o possible UTI and hematuria since Tuesday this week. Pt denies pain except with urination. Pt reports taking OTC medication w/o relief.

## 2024-10-04 NOTE — ED Provider Notes (Signed)
 [facility] AT [facility] Provider Note   CSN: [id number] Arrival date & time: 10/04/24  9086     Patient presents with: Hematuria   Courtney Murray is a 33 y.o. female presenting with a 2-day history of dysuria and hematuria.  She describes frequent urination with pressure and discomfort at the end of her urine stream.  She also has endorsed pink-tinged urine, although has not noticed since she has been taking an OTC Pyridium product which has turned her urine orange.  She denies fevers or chills, nausea, vomiting, flank pain, vaginal discharge or irritation.  No recent antibiotic use.   The history is provided by the patient.       Prior to Admission medications   Medication Sig Start Date End Date Taking? Authorizing Provider  cephALEXin (KEFLEX) 500 MG capsule Take 1 capsule (500 mg total) by mouth 2 (two) times daily for 7 days. 10/04/24 10/11/24 Yes Bonni Neuser, PA-C  PHENAZOPYRIDINE HCL PO Take 2 tablets by mouth as needed (Urinary Tract).   Yes [provider]    Allergies: Almond (diagnostic) and Peanut-containing drug products    Review of Systems  Constitutional:  Negative for chills and fever.  HENT:  Negative for congestion and sore throat.   Eyes: Negative.   Respiratory:  Negative for chest tightness and shortness of breath.   Cardiovascular:  Negative for chest pain.  Gastrointestinal:  Negative for abdominal pain, nausea and vomiting.  Genitourinary:  Positive for dysuria, frequency, hematuria and urgency. Negative for menstrual problem.  Musculoskeletal:  Negative for arthralgias, joint swelling and neck pain.  Skin: Negative.  Negative for rash and wound.  Neurological:  Negative for dizziness, weakness, light-headedness, numbness and headaches.  Psychiatric/Behavioral: Negative.      Updated Vital Signs BP 135/85 (BP Location: Right Arm)   Pulse (!) 107   Temp 97.9 F (36.6 C) (Oral)   Resp 16   Ht 4' 11  (1.499 m)   Wt 83.9 kg   LMP 09/06/2024 (Exact Date)   SpO2 100%   BMI 37.36 kg/m   Physical Exam Vitals and nursing note reviewed.  Constitutional:      Appearance: She is well-developed.  HENT:     Head: Normocephalic and atraumatic.  Cardiovascular:     Rate and Rhythm: Normal rate.  Pulmonary:     Effort: Pulmonary effort is normal.  Abdominal:     General: Bowel sounds are normal.     Palpations: Abdomen is soft.     Tenderness: There is no abdominal tenderness. There is no guarding.  Musculoskeletal:        General: Normal range of motion.     Cervical back: Normal range of motion.  Skin:    General: Skin is warm and dry.  Neurological:     Mental Status: She is alert and oriented to person, place, and time.     (all labs ordered are listed, but only abnormal results are displayed) Labs Reviewed  URINALYSIS, ROUTINE W REFLEX MICROSCOPIC - Abnormal; Notable for the following components:      Result Value   Color, Urine AMBER (*)    Specific Gravity, Urine 1.003 (*)    Hgb urine dipstick MODERATE (*)    Nitrite POSITIVE (*)    Leukocytes,Ua MODERATE (*)    All other components within normal limits  PREGNANCY, URINE    EKG: None  Radiology: No results found.   Procedures   Medications Ordered in the ED  cephALEXin  (KEFLEX ) capsule 500 mg (has no administration in time range)                                    Medical Decision Making Patient presenting with dysuria, no flank pain, no nausea vomiting or fevers.  No vaginal complaints.  Differential diagnosis including UTI, pyelonephritis, kidney stones, vaginal infection although patient denies any vaginal complaints.  Her urine does confirm what appears to be a simple UTI.  She is started on Keflex , she may continue using her OTC Pyridium product, encouraged fluids and strict return precautions were outlined.  Parent follow-up anticipated.  Amount and/or Complexity of Data Reviewed Labs: ordered.     Details: Urinalysis is significant for moderate hemoglobin, nitrite positive, greater than 50 WBCs.  She is not pregnant.  Risk Prescription drug management.        Final diagnoses:  Lower urinary tract infectious disease    ED Discharge Orders          Ordered    cephALEXin  (KEFLEX ) 500 MG capsule  2 times daily        10/04/24 1057               Orvil Faraone, PA-C 10/04/24 1059    Towana Ozell BROCKS, MD 10/04/24 1720

## 2024-11-13 ENCOUNTER — Ambulatory Visit: Admitting: Family Medicine

## 2024-11-13 VITALS — BP 111/70 | HR 61 | Temp 97.5°F | Ht 59.0 in | Wt 186.0 lb

## 2024-11-13 DIAGNOSIS — N3000 Acute cystitis without hematuria: Secondary | ICD-10-CM | POA: Insufficient documentation

## 2024-11-13 MED ORDER — CEPHALEXIN 500 MG PO CAPS: 500.0000 mg | ORAL_CAPSULE | Freq: Two times a day (BID) | ORAL | 0 refills | Status: AC

## 2024-11-13 NOTE — Progress Notes (Signed)
 "  Subjective:  Patient ID: Courtney Murray, female    DOB: [birth year]  Age: 33 y.o. MRN: [id number]  CC:   Chief Complaint  Patient presents with   Urinary Tract Infection    Patient is here for having frequent urination. Feels like she has to pee all the time and having lower abdominal pain    HPI:  33 year old female presents with concern for UTI.  2-day history of urinary symptoms.  She reports urinary frequency and suprapubic pain.  She has had some nausea as well.  No fever.  No flank pain.  No back pain.  No hematuria.  Has a history of UTI.  She has been trying to hydrate.  No medications tried.  No other complaints at this time.  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    Social Hx   Social History   Socioeconomic History   Marital status: Significant Other    Spouse name: Not on file   Number of children: 1   Years of education: Not on file   Highest education level: Some college, no degree  Occupational History   Not on file  Tobacco Use   Smoking status: Some Days    Types: Cigars   Smokeless tobacco: Never  Vaping Use   Vaping status: Never Used  Substance and Sexual Activity   Alcohol use: Yes   Drug use: No   Sexual activity: Yes    Birth control/protection: None  Other Topics Concern   Not on file  Social History Narrative   Not on file   Social Drivers of Health   Tobacco Use: High Risk (10/04/2024)   Patient History    Smoking Tobacco Use: Some Days    Smokeless Tobacco Use: Never    Passive Exposure: Not on file  Financial Resource Strain: Low Risk (11/12/2024)   Overall Financial Resource Strain (CARDIA)    Difficulty of Paying Living Expenses: Not hard at all  Food Insecurity: No Food Insecurity  (11/12/2024)   Epic    Worried About Radiation Protection Practitioner of Food in the Last Year: Never true    Ran Out of Food in the Last Year: Never true  Transportation Needs: No Transportation Needs (11/12/2024)   Epic    Lack of Transportation (Medical): No    Lack of Transportation (Non-Medical): No  Physical Activity: Inactive (11/12/2024)   Exercise Vital Sign    Days of Exercise per Week: 0 days    Minutes of Exercise per Session: Not on file  Stress: No Stress Concern Present (11/12/2024)   [employer] of Occupational Health - Occupational Stress Questionnaire    Feeling of Stress: Not at all  Social Connections: Moderately Isolated (11/12/2024)   Social Connection and Isolation Panel    Frequency of Communication with Friends and Family: More than three times a week    Frequency of Social Gatherings with Friends and Family: Three times a week    Attends Religious Services: 1 to 4 times per year    Active Member of Clubs or Organizations: No    Attends Banker Meetings: Not on file    Marital Status: Never married  Depression (PHQ2-9): Low Risk (09/14/2024)   Depression (PHQ2-9)    PHQ-2 Score: 0  Alcohol Screen: Low  Risk (11/12/2024)   Alcohol Screen    Last Alcohol Screening Score (AUDIT): 1  Housing: Low Risk (11/12/2024)   Epic    Unable to Pay for Housing in the Last Year: No    Number of Times Moved in the Last Year: 0    Homeless in the Last Year: No  Utilities: Not At Risk (09/14/2024)   Epic    Threatened with loss of utilities: No  Health Literacy: Not on file    Review of Systems Per HPI  Objective:  BP 111/70 (BP Location: Left Arm, Patient Position: Sitting)   Pulse 61   Temp (!) 97.5 F (36.4 C)   Ht 4' 11 (1.499 m)   Wt 186 lb (84.4 kg)   SpO2 100%   BMI 37.57 kg/m      11/13/2024    8:43 AM 10/04/2024   11:00 AM 10/04/2024    9:37 AM  BP/Weight  Systolic BP 111 100 135  Diastolic BP 70 58 85  Wt. (Lbs) 186    BMI 37.57 kg/m2       Physical Exam Vitals and nursing note reviewed.  Constitutional:      General: She is not in acute distress.    Appearance: Normal appearance. She is obese.  HENT:     Head: Normocephalic and atraumatic.  Cardiovascular:     Rate and Rhythm: Normal rate and regular rhythm.  Pulmonary:     Effort: Pulmonary effort is normal.     Breath sounds: Normal breath sounds.  Abdominal:     General: There is no distension.     Palpations: Abdomen is soft.     Comments: Mild suprapubic tenderness to palpation.  Neurological:     Mental Status: She is alert.     Lab Results  Component Value Date   WBC 7.1 09/14/2024   HGB 14.4 09/14/2024   HCT 45.5 09/14/2024   PLT 308 09/14/2024   GLUCOSE 102 (H) 09/14/2024   CHOL 170 09/14/2024   TRIG 81 09/14/2024   HDL 60 09/14/2024   LDLCALC 95 09/14/2024   ALT 16 09/14/2024   AST 18 09/14/2024   NA 141 09/14/2024   K 5.0 09/14/2024   CL 103 09/14/2024   CREATININE 0.99 09/14/2024   BUN 11 09/14/2024   CO2 24 09/14/2024   TSH 2.450 04/29/2022   HGBA1C 5.9 (H) 09/14/2024     Assessment & Plan:  Acute cystitis without hematuria Assessment & Plan: History consistent with UTI.  Sending urine off for urinalysis and culture.  We do not have any urine dipsticks at this time.  Treating empirically with Keflex  while awaiting culture.  Orders: -     Urinalysis -     Urine Culture -     Cephalexin ; Take 1 capsule (500 mg total) by mouth 2 (two) times daily.  Dispense: 14 capsule; Refill: 0    Follow-up:  Return if symptoms worsen or fail to improve.  Jacqulyn Ahle DO [facility]  "

## 2024-11-13 NOTE — Assessment & Plan Note (Signed)
 History consistent with UTI.  Sending urine off for urinalysis and culture.  We do not have any urine dipsticks at this time.  Treating empirically with Keflex  while awaiting culture.

## 2024-11-16 LAB — URINALYSIS
Bilirubin, UA: NEGATIVE
Glucose, UA: NEGATIVE
Ketones, UA: NEGATIVE
Nitrite, UA: NEGATIVE
Protein,UA: NEGATIVE
RBC, UA: NEGATIVE
Specific Gravity, UA: 1.016 (ref 1.005–1.030)
Urobilinogen, Ur: 1 mg/dL (ref 0.2–1.0)
pH, UA: 8 — ABNORMAL HIGH (ref 5.0–7.5)

## 2024-11-16 LAB — URINE CULTURE

## 2024-12-21 ENCOUNTER — Other Ambulatory Visit: Payer: Self-pay | Admitting: Medical Genetics

## 2024-12-26 ENCOUNTER — Other Ambulatory Visit (HOSPITAL_COMMUNITY)
